# Patient Record
Sex: Female | Born: 1970 | Race: Black or African American | Hispanic: No | Marital: Married | Smoking: Never smoker
Health system: Southern US, Community
[De-identification: ages and names within clinical notes are randomized; demographics above are authoritative.]

## PROBLEM LIST (undated history)

## (undated) DIAGNOSIS — M549 Dorsalgia, unspecified: Secondary | ICD-10-CM

## (undated) DIAGNOSIS — J302 Other seasonal allergic rhinitis: Secondary | ICD-10-CM

## (undated) DIAGNOSIS — R600 Localized edema: Secondary | ICD-10-CM

## (undated) DIAGNOSIS — M19049 Primary osteoarthritis, unspecified hand: Secondary | ICD-10-CM

## (undated) DIAGNOSIS — Z91018 Allergy to other foods: Secondary | ICD-10-CM

## (undated) DIAGNOSIS — E559 Vitamin D deficiency, unspecified: Secondary | ICD-10-CM

## (undated) DIAGNOSIS — K219 Gastro-esophageal reflux disease without esophagitis: Secondary | ICD-10-CM

## (undated) DIAGNOSIS — K5900 Constipation, unspecified: Secondary | ICD-10-CM

## (undated) DIAGNOSIS — M2550 Pain in unspecified joint: Secondary | ICD-10-CM

## (undated) DIAGNOSIS — M170 Bilateral primary osteoarthritis of knee: Secondary | ICD-10-CM

## (undated) HISTORY — DX: Other seasonal allergic rhinitis: J30.2

## (undated) HISTORY — DX: Gastro-esophageal reflux disease without esophagitis: K21.9

## (undated) HISTORY — PX: KNEE ARTHROSCOPY: SUR90

## (undated) HISTORY — DX: Vitamin D deficiency, unspecified: E55.9

## (undated) HISTORY — DX: Primary osteoarthritis, unspecified hand: M19.049

## (undated) HISTORY — DX: Localized edema: R60.0

## (undated) HISTORY — DX: Constipation, unspecified: K59.00

## (undated) HISTORY — DX: Pain in unspecified joint: M25.50

## (undated) HISTORY — DX: Allergy to other foods: Z91.018

## (undated) HISTORY — PX: ABDOMINAL SURGERY: SHX537

## (undated) HISTORY — DX: Bilateral primary osteoarthritis of knee: M17.0

## (undated) HISTORY — DX: Dorsalgia, unspecified: M54.9

## (undated) HISTORY — PX: OTHER SURGICAL HISTORY: SHX169

---

## 2016-04-08 ENCOUNTER — Encounter (HOSPITAL_BASED_OUTPATIENT_CLINIC_OR_DEPARTMENT_OTHER): Payer: Self-pay | Admitting: *Deleted

## 2016-04-08 ENCOUNTER — Emergency Department (HOSPITAL_BASED_OUTPATIENT_CLINIC_OR_DEPARTMENT_OTHER)
Admission: EM | Admit: 2016-04-08 | Discharge: 2016-04-09 | Disposition: A | Discharge status: Home / Self Care | Payer: BLUE CROSS/BLUE SHIELD | Attending: Emergency Medicine | Admitting: Emergency Medicine

## 2016-04-08 DIAGNOSIS — R05 Cough: Secondary | ICD-10-CM | POA: Diagnosis not present

## 2016-04-08 DIAGNOSIS — J029 Acute pharyngitis, unspecified: Secondary | ICD-10-CM | POA: Diagnosis not present

## 2016-04-08 DIAGNOSIS — R059 Cough, unspecified: Secondary | ICD-10-CM

## 2016-04-08 NOTE — ED Triage Notes (Signed)
Pt c/o sore throat x 1 week

## 2016-04-09 LAB — RAPID STREP SCREEN (MED CTR MEBANE ONLY): Streptococcus, Group A Screen (Direct): NEGATIVE

## 2016-04-09 MED ORDER — IBUPROFEN 600 MG PO TABS: 600.0000 mg | ORAL_TABLET | Freq: Four times a day (QID) | ORAL | 0 refills | Status: DC | PRN

## 2016-04-09 MED ORDER — IBUPROFEN 400 MG PO TABS
600.0000 mg | ORAL_TABLET | Freq: Once | ORAL | Status: AC
  Administered 2016-04-09: 600 mg via ORAL
  Filled 2016-04-09: qty 1

## 2016-04-09 MED ORDER — MAGIC MOUTHWASH W/LIDOCAINE: 5.0000 mL | Freq: Three times a day (TID) | ORAL | 0 refills | Status: DC | PRN

## 2016-04-09 MED ORDER — BENZONATATE 100 MG PO CAPS
200.0000 mg | ORAL_CAPSULE | Freq: Once | ORAL | Status: AC
  Administered 2016-04-09: 200 mg via ORAL
  Filled 2016-04-09: qty 2

## 2016-04-09 MED ORDER — BENZONATATE 100 MG PO CAPS: 100.0000 mg | ORAL_CAPSULE | Freq: Three times a day (TID) | ORAL | 0 refills | Status: DC

## 2016-04-09 MED ORDER — IBUPROFEN 400 MG PO TABS
ORAL_TABLET | ORAL | Status: AC
  Filled 2016-04-09: qty 1

## 2016-04-09 NOTE — ED Provider Notes (Signed)
[facility]   CSN: [id number] Arrival date & time: 04/08/16  2346  First Provider Contact:  None       History   Chief Complaint Chief Complaint  Patient presents with  . Sore Throat    HPI Bailey Cooper is a 45 y.o. female.  HPI  Pt comes in with cc of sore throat and cough. Her symptoms have been present off and on for 2 weeks now, but they have worsened over the last 2-3 days. Pt's voice is now hoarse and she has some pain with swallowing. Also has cough, worse at night without any new cough. Pt denies fever and the cough has led to clear phlegm.  History reviewed. No pertinent past medical history.    There are no active problems to display for this patient.   Past Surgical History:  Procedure Laterality Date  . ABDOMINAL SURGERY      OB History    Gravida Para Term Preterm AB Living   1             SAB TAB Ectopic Multiple Live Births                   Home Medications    Prior to Admission medications   Medication Sig Start Date End Date Taking? Authorizing Provider  benzonatate (TESSALON) 100 MG capsule Take 1 capsule (100 mg total) by mouth every 8 (eight) hours. 04/09/16   Derwood Kaplan, MD  ibuprofen (ADVIL,MOTRIN) 600 MG tablet Take 1 tablet (600 mg total) by mouth every 6 (six) hours as needed. 04/09/16   Derwood Kaplan, MD  magic mouthwash w/lidocaine SOLN Take 5 mLs by mouth 3 (three) times daily as needed for mouth pain. 04/09/16   Derwood Kaplan, MD    Family History No family history on file.  Social History Social History  Substance Use Topics  . Smoking status: Never Smoker  . Smokeless tobacco: Not on file  . Alcohol use No     Allergies   Penicillins   Review of Systems Review of Systems  Constitutional: Positive for activity change.  HENT: Positive for sore throat and voice change. Negative for tinnitus and trouble swallowing.   Respiratory: Positive for cough. Negative for shortness of breath and  wheezing.   Cardiovascular: Negative for chest pain.  Gastrointestinal: Negative for abdominal pain, nausea and vomiting.  Genitourinary: Negative for dysuria.  Musculoskeletal: Negative for neck pain.  Neurological: Negative for headaches.     Physical Exam Updated Vital Signs BP 147/94 (BP Location: Left Arm)   Pulse 103   Temp 98.3 F (36.8 C) (Oral)   Ht  (1.676 m)   Wt 243 lb (110.2 kg)   LMP 03/12/2016   SpO2 100%   BMI 39.22 kg/m   Physical Exam  Constitutional: She is oriented to person, place, and time. She appears well-developed and well-nourished.  HENT:  Head: Normocephalic and atraumatic.  Eyes: EOM are normal. Pupils are equal, round, and reactive to light.  Neck: Neck supple. No tracheal deviation present.  Pt has tonsillar erythema and enlargement, no exudate seen. No trismus or drooling seen.  Cardiovascular: Normal rate, regular rhythm and normal heart sounds.   No murmur heard. Pulmonary/Chest: Effort normal. No stridor. No respiratory distress.  Abdominal: Soft. She exhibits no distension. There is no tenderness. There is no rebound and no guarding.  Lymphadenopathy:    She has cervical adenopathy.  Neurological: She is alert and oriented to person,  place, and time.  Skin: Skin is warm and dry.  Nursing note and vitals reviewed.    ED Treatments / Results  Labs (all labs ordered are listed, but only abnormal results are displayed) Labs Reviewed  RAPID STREP SCREEN (NOT AT [facility])  CULTURE, GROUP A STREP [facility])    EKG  EKG Interpretation None       Radiology No results found.  Procedures Procedures (including critical care time)  Medications Ordered in ED Medications  ibuprofen (ADVIL,MOTRIN) tablet 600 mg (600 mg Oral Given 04/09/16 0051)  benzonatate (TESSALON) capsule 200 mg (200 mg Oral Given 04/09/16 0050)     Initial Impression / Assessment and Plan / ED Course  I have reviewed the triage vital signs and the nursing  notes.  Pertinent labs & imaging results that were available during my care of the patient were reviewed by me and considered in my medical decision making (see chart for details).  Clinical Course    Likely viral URI leading to pharyngitis and cough. Pt has no red flags for deep throat infection. Xrays not indicated. Rapid strep is neg. Will tx symptoms.  Final Clinical Impressions(s) / ED Diagnoses   Final diagnoses:  Pharyngitis  Cough    New Prescriptions Discharge Medication List as of 04/09/2016 12:40 AM    START taking these medications   Details  benzonatate (TESSALON) 100 MG capsule Take 1 capsule (100 mg total) by mouth every 8 (eight) hours., Starting Thu 04/09/2016, Print    ibuprofen (ADVIL,MOTRIN) 600 MG tablet Take 1 tablet (600 mg total) by mouth every 6 (six) hours as needed., Starting Thu 04/09/2016, Print    magic mouthwash w/lidocaine SOLN Take 5 mLs by mouth 3 (three) times daily as needed for mouth pain., Starting Thu 04/09/2016, Print         Derwood Kaplan, MD 04/09/16 [phone]

## 2016-04-12 LAB — CULTURE, GROUP A STREP (THRC)

## 2017-09-23 DIAGNOSIS — F902 Attention-deficit hyperactivity disorder, combined type: Secondary | ICD-10-CM | POA: Diagnosis not present

## 2017-10-19 DIAGNOSIS — M25561 Pain in right knee: Secondary | ICD-10-CM | POA: Diagnosis not present

## 2017-11-04 DIAGNOSIS — F320 Major depressive disorder, single episode, mild: Secondary | ICD-10-CM | POA: Diagnosis not present

## 2017-11-23 DIAGNOSIS — F320 Major depressive disorder, single episode, mild: Secondary | ICD-10-CM | POA: Diagnosis not present

## 2018-01-05 DIAGNOSIS — F320 Major depressive disorder, single episode, mild: Secondary | ICD-10-CM | POA: Diagnosis not present

## 2018-01-11 DIAGNOSIS — F320 Major depressive disorder, single episode, mild: Secondary | ICD-10-CM | POA: Diagnosis not present

## 2018-01-19 DIAGNOSIS — F320 Major depressive disorder, single episode, mild: Secondary | ICD-10-CM | POA: Diagnosis not present

## 2018-01-31 DIAGNOSIS — F320 Major depressive disorder, single episode, mild: Secondary | ICD-10-CM | POA: Diagnosis not present

## 2018-02-07 DIAGNOSIS — F320 Major depressive disorder, single episode, mild: Secondary | ICD-10-CM | POA: Diagnosis not present

## 2018-02-21 DIAGNOSIS — F320 Major depressive disorder, single episode, mild: Secondary | ICD-10-CM | POA: Diagnosis not present

## 2018-03-07 DIAGNOSIS — F320 Major depressive disorder, single episode, mild: Secondary | ICD-10-CM | POA: Diagnosis not present

## 2018-03-16 DIAGNOSIS — F320 Major depressive disorder, single episode, mild: Secondary | ICD-10-CM | POA: Diagnosis not present

## 2018-03-23 DIAGNOSIS — F320 Major depressive disorder, single episode, mild: Secondary | ICD-10-CM | POA: Diagnosis not present

## 2018-03-30 DIAGNOSIS — F320 Major depressive disorder, single episode, mild: Secondary | ICD-10-CM | POA: Diagnosis not present

## 2018-04-15 ENCOUNTER — Ambulatory Visit (HOSPITAL_COMMUNITY)
Admission: EM | Admit: 2018-04-15 | Discharge: 2018-04-15 | Disposition: A | Discharge status: Home / Self Care | Payer: BLUE CROSS/BLUE SHIELD | Attending: Internal Medicine | Admitting: Internal Medicine

## 2018-04-15 ENCOUNTER — Encounter (HOSPITAL_COMMUNITY): Payer: Self-pay

## 2018-04-15 DIAGNOSIS — B9789 Other viral agents as the cause of diseases classified elsewhere: Secondary | ICD-10-CM

## 2018-04-15 DIAGNOSIS — J069 Acute upper respiratory infection, unspecified: Secondary | ICD-10-CM

## 2018-04-15 MED ORDER — BENZONATATE 100 MG PO CAPS: 100.0000 mg | ORAL_CAPSULE | Freq: Three times a day (TID) | ORAL | 0 refills | Status: DC

## 2018-04-15 MED ORDER — IPRATROPIUM BROMIDE 0.06 % NA SOLN: 2.0000 | Freq: Four times a day (QID) | NASAL | 0 refills | Status: DC

## 2018-04-15 MED ORDER — HYDROCOD POLST-CPM POLST ER 10-8 MG/5ML PO SUER: 5.0000 mL | Freq: Every evening | ORAL | 0 refills | Status: DC | PRN

## 2018-04-15 NOTE — ED Provider Notes (Signed)
[facility]    CSN: [id number] Arrival date & time: 04/15/18  0818     History   Chief Complaint Chief Complaint  Patient presents with  . Cough    HPI Almarosa Bohac is a 47 y.o. female.   47 year old female comes in for 3-day history of cough.  Denies rhinorrhea, nasal congestion, but states with postnasal drip.  Denies sore throat, ear pain.  Denies fever, chills, night sweats.  Decreased appetite without abdominal pain, nausea, vomiting.  OTC cold medications, Allegra-D, Flonase without relief.  Positive sick contact.  Never smoker.     History reviewed. No pertinent past medical history.  There are no active problems to display for this patient.   Past Surgical History:  Procedure Laterality Date  . ABDOMINAL SURGERY      OB History    Gravida  1   Para      Term      Preterm      AB      Living        SAB      TAB      Ectopic      Multiple      Live Births               Home Medications    Prior to Admission medications   Medication Sig Start Date End Date Taking? Authorizing Provider  benzonatate (TESSALON) 100 MG capsule Take 1 capsule (100 mg total) by mouth every 8 (eight) hours. 04/15/18   Cathie Hoops, Tondalaya Perren V, PA-C  chlorpheniramine-HYDROcodone (TUSSIONEX PENNKINETIC ER) 10-8 MG/5ML SUER Take 5 mLs by mouth at bedtime as needed for cough. 04/15/18   Cathie Hoops, Ellean Firman V, PA-C  ipratropium (ATROVENT) 0.06 % nasal spray Place 2 sprays into both nostrils 4 (four) times daily. 04/15/18   Belinda Fisher, PA-C    Family History Family History  Problem Relation Age of Onset  . Heart disease Mother   . Diabetes Mother   . Hypertension Mother   . Healthy Father     Social History Social History   Tobacco Use  . Smoking status: Never Smoker  Substance Use Topics  . Alcohol use: No  . Drug use: Not on file     Allergies   Penicillins   Review of Systems Review of Systems  Reason unable to perform ROS: See HPI as above.     Physical  Exam Triage Vital Signs ED Triage Vitals  Enc Vitals Group     BP 04/15/18 0856 133/83     Pulse Rate 04/15/18 0856 93     Resp 04/15/18 0856 16     Temp 04/15/18 0856 98.3 F (36.8 C)     Temp Source 04/15/18 0856 Oral     SpO2 04/15/18 0856 96 %     Weight --      Height --      Head Circumference --      Peak Flow --      Pain Score 04/15/18 0857 3     Pain Loc --      Pain Edu? --      Excl. in GC? --    No data found.  Updated Vital Signs BP 133/83 (BP Location: Left Arm)   Pulse 93   Temp 98.3 F (36.8 C) (Oral)   Resp 16   SpO2 96%   Breastfeeding? Unknown   Physical Exam  Constitutional: She is oriented to person, place, and time. She appears  well-developed and well-nourished. No distress.  HENT:  Head: Normocephalic and atraumatic.  Right Ear: Tympanic membrane, external ear and ear canal normal. Tympanic membrane is not erythematous and not bulging.  Left Ear: Tympanic membrane, external ear and ear canal normal. Tympanic membrane is not erythematous and not bulging.  Nose: Nose normal. Right sinus exhibits no maxillary sinus tenderness and no frontal sinus tenderness. Left sinus exhibits no maxillary sinus tenderness and no frontal sinus tenderness.  Mouth/Throat: Uvula is midline, oropharynx is clear and moist and mucous membranes are normal.  Eyes: Pupils are equal, round, and reactive to light. Conjunctivae are normal.  Neck: Normal range of motion. Neck supple.  Cardiovascular: Normal rate, regular rhythm and normal heart sounds. Exam reveals no gallop and no friction rub.  No murmur heard. Pulmonary/Chest: Effort normal and breath sounds normal. No accessory muscle usage or stridor. No respiratory distress. She has no decreased breath sounds. She has no wheezes. She has no rhonchi. She has no rales.  Lymphadenopathy:    She has no cervical adenopathy.  Neurological: She is alert and oriented to person, place, and time.  Skin: Skin is warm and dry.   Psychiatric: She has a normal mood and affect. Her behavior is normal. Judgment normal.   UC Treatments / Results  Labs (all labs ordered are listed, but only abnormal results are displayed) Labs Reviewed - No data to display  EKG None  Radiology No results found.  Procedures Procedures (including critical care time)  Medications Ordered in UC Medications - No data to display  Initial Impression / Assessment and Plan / UC Course  I have reviewed the triage vital signs and the nursing notes.  Pertinent labs & imaging results that were available during my care of the patient were reviewed by me and considered in my medical decision making (see chart for details).    Discussed with patient history and exam most consistent with viral URI. Symptomatic treatment as needed.  Tussionex as needed at nighttime.  Push fluids. Return precautions given.   Final Clinical Impressions(s) / UC Diagnoses   Final diagnoses:  Viral URI with cough    ED Prescriptions    Medication Sig Dispense Auth. Provider   benzonatate (TESSALON) 100 MG capsule Take 1 capsule (100 mg total) by mouth every 8 (eight) hours. 21 capsule Almedia Cordell V, PA-C   ipratropium (ATROVENT) 0.06 % nasal spray Place 2 sprays into both nostrils 4 (four) times daily. 15 mL Maebel Marasco V, PA-C   chlorpheniramine-HYDROcodone (TUSSIONEX PENNKINETIC ER) 10-8 MG/5ML SUER Take 5 mLs by mouth at bedtime as needed for cough. 50 mL Linward HeadlandYu, Reginal Wojcicki V, PA-C     Controlled Substance Prescriptions [place] Controlled Substance Registry consulted? Yes, I have consulted the [place] Controlled Substances Registry for this patient, and feel the risk/benefit ratio today is favorable for proceeding with this prescription for a controlled substance.   Belinda FisherYu, Dacoda Spallone V, PA-C 04/15/18 1108

## 2018-04-15 NOTE — ED Triage Notes (Signed)
Pt presents with cough x 3 days. No relief with OTC medication.

## 2018-04-15 NOTE — Discharge Instructions (Signed)
Tessalon for cough. Tussionex for cough at night. Continue allegra-D, flonase. Start atrovent nasal spray for post nasal drainage. You can use over the counter nasal saline rinse such as neti pot for nasal congestion. Keep hydrated, your urine should be clear to pale yellow in color. Tylenol/motrin for fever and pain. Monitor for any worsening of symptoms, chest pain, shortness of breath, wheezing, swelling of the throat, follow up for reevaluation.   For sore throat/cough try using a honey-based tea. Use 3 teaspoons of honey with juice squeezed from half lemon. Place shaved pieces of ginger into 1/2-1 cup of water and warm over stove top. Then mix the ingredients and repeat every 4 hours as needed.

## 2018-04-25 ENCOUNTER — Ambulatory Visit (HOSPITAL_COMMUNITY)
Admission: EM | Admit: 2018-04-25 | Discharge: 2018-04-25 | Discharge status: Absent without leave | Payer: BLUE CROSS/BLUE SHIELD

## 2018-04-25 ENCOUNTER — Ambulatory Visit (INDEPENDENT_AMBULATORY_CARE_PROVIDER_SITE_OTHER): Payer: BLUE CROSS/BLUE SHIELD

## 2018-04-25 ENCOUNTER — Ambulatory Visit (HOSPITAL_COMMUNITY)
Admission: EM | Admit: 2018-04-25 | Discharge: 2018-04-25 | Disposition: A | Discharge status: Home / Self Care | Payer: BLUE CROSS/BLUE SHIELD | Attending: Family Medicine | Admitting: Family Medicine

## 2018-04-25 ENCOUNTER — Encounter (HOSPITAL_COMMUNITY): Payer: Self-pay | Admitting: Emergency Medicine

## 2018-04-25 DIAGNOSIS — J22 Unspecified acute lower respiratory infection: Secondary | ICD-10-CM

## 2018-04-25 DIAGNOSIS — R05 Cough: Secondary | ICD-10-CM | POA: Diagnosis not present

## 2018-04-25 MED ORDER — ALBUTEROL SULFATE HFA 108 (90 BASE) MCG/ACT IN AERS: 1.0000 | INHALATION_SPRAY | Freq: Four times a day (QID) | RESPIRATORY_TRACT | 0 refills | Status: DC | PRN

## 2018-04-25 MED ORDER — AZITHROMYCIN 250 MG PO TABS: ORAL_TABLET | ORAL | 0 refills | Status: AC

## 2018-04-25 NOTE — Discharge Instructions (Signed)
Push fluids to ensure adequate hydration and keep secretions thin.  Tylenol and/or ibuprofen as needed for pain or fevers.  Use of inhaler as needed for cough or shortness of breath .  May continue with over the counter treatments and medications prescribed as needed for symptoms.  Sudafed may be helpful with secretions.  If symptoms worsen or do not improve in the next week to return to be seen or to follow up with your PCP.

## 2018-04-25 NOTE — ED Triage Notes (Signed)
Pt here for cough x 3 weeks with some blood tinged sputum at times

## 2018-04-25 NOTE — ED Notes (Signed)
Bed: UC01 Expected date:  Expected time:  Means of arrival:  Comments: Appt 

## 2018-04-25 NOTE — ED Provider Notes (Signed)
[facility]    CSN: [id number] Arrival date & time: 04/25/18  1450     History   Chief Complaint Chief Complaint  Patient presents with  . Cough    appt 1448    HPI Bailey Cooper is a 47 y.o. female.   Bailey Cooper presents with complaints of persistent cough and congestion. Was seen here 8/2 and states symptoms started up to a week prior. Symptoms have somewhat waxed and waned but over the past few days worsened. Hard time sleeping due to cough and post nasal drip. At times will cough so hard makes her vomit. Decreased appetite. No fevers. No ear pain or sore throat. Chest and ribs feel sore from coughing. Has noticed some blood tinged sputum at times. Has been using nasal spray, mucinex, tessalon, delsym which have not helped. Son and mother with pna. States has had history of wheezing in the past but no asthma/copd diagnosis. Does not smoke. Without contributing medical history.      ROS per HPI.      History reviewed. No pertinent past medical history.  There are no active problems to display for this patient.   Past Surgical History:  Procedure Laterality Date  . ABDOMINAL SURGERY      OB History    Gravida  1   Para      Term      Preterm      AB      Living        SAB      TAB      Ectopic      Multiple      Live Births               Home Medications    Prior to Admission medications   Medication Sig Start Date End Date Taking? Authorizing Provider  albuterol (PROAIR HFA) 108 (90 Base) MCG/ACT inhaler Inhale 1-2 puffs into the lungs every 6 (six) hours as needed for wheezing or shortness of breath. 04/25/18   Georgetta HaberBurky, Natalie B, NP  azithromycin (ZITHROMAX) 250 MG tablet Take 2 tablets (500 mg total) by mouth daily for 1 day, THEN 1 tablet (250 mg total) daily for 4 days. 04/25/18 04/30/18  Georgetta HaberBurky, Natalie B, NP  benzonatate (TESSALON) 100 MG capsule Take 1 capsule (100 mg total) by mouth every 8 (eight) hours. 04/15/18   Cathie HoopsYu, Amy V, PA-C    chlorpheniramine-HYDROcodone (TUSSIONEX PENNKINETIC ER) 10-8 MG/5ML SUER Take 5 mLs by mouth at bedtime as needed for cough. Patient not taking: Reported on 04/25/2018 04/15/18   Belinda FisherYu, Amy V, PA-C  ipratropium (ATROVENT) 0.06 % nasal spray Place 2 sprays into both nostrils 4 (four) times daily. 04/15/18   Belinda FisherYu, Amy V, PA-C    Family History Family History  Problem Relation Age of Onset  . Heart disease Mother   . Diabetes Mother   . Hypertension Mother   . Healthy Father     Social History Social History   Tobacco Use  . Smoking status: Never Smoker  Substance Use Topics  . Alcohol use: No  . Drug use: Not on file     Allergies   Penicillins   Review of Systems Review of Systems   Physical Exam Triage Vital Signs ED Triage Vitals  Enc Vitals Group     BP 04/25/18 1508 137/80     Pulse Rate 04/25/18 1508 85     Resp 04/25/18 1508 18     Temp 04/25/18 1508 98.7  F (37.1 C)     Temp Source 04/25/18 1508 Oral     SpO2 04/25/18 1508 98 %     Weight --      Height --      Head Circumference --      Peak Flow --      Pain Score 04/25/18 1509 7     Pain Loc --      Pain Edu? --      Excl. in GC? --    No data found.  Updated Vital Signs BP 137/80 (BP Location: Left Arm)   Pulse 85   Temp 98.7 F (37.1 C) (Oral)   Resp 18   SpO2 98%    Physical Exam  Constitutional: She is oriented to person, place, and time. She appears well-developed and well-nourished. No distress.  HENT:  Head: Normocephalic and atraumatic.  Right Ear: Tympanic membrane, external ear and ear canal normal.  Left Ear: Tympanic membrane, external ear and ear canal normal.  Nose: Nose normal.  Mouth/Throat: Uvula is midline, oropharynx is clear and moist and mucous membranes are normal. No tonsillar exudate.  Eyes: Pupils are equal, round, and reactive to light. Conjunctivae and EOM are normal.  Cardiovascular: Normal rate, regular rhythm and normal heart sounds.  Pulmonary/Chest: Effort  normal and breath sounds normal.  Frequent strong dry cough noted, illicite by deep inspiration as well as by speaking too long   Neurological: She is alert and oriented to person, place, and time.  Skin: Skin is warm and dry.     UC Treatments / Results  Labs (all labs ordered are listed, but only abnormal results are displayed) Labs Reviewed - No data to display  EKG None  Radiology Dg Chest 2 View  Result Date: 04/25/2018 CLINICAL DATA:  Cough and blood tinged sputum EXAM: CHEST - 2 VIEW COMPARISON:  None. FINDINGS: The heart size and mediastinal contours are within normal limits. Both lungs are clear. The visualized skeletal structures are unremarkable. IMPRESSION: No active cardiopulmonary disease. Electronically Signed   By: Deatra Robinson M.D.   On: 04/25/2018 15:48    Procedures Procedures (including critical care time)  Medications Ordered in UC Medications - No data to display  Initial Impression / Assessment and Plan / UC Course  I have reviewed the triage vital signs and the nursing notes.  Pertinent labs & imaging results that were available during my care of the patient were reviewed by me and considered in my medical decision making (see chart for details).     >10 days of symptoms. Chest xray reassuring. Will provide empiric coverage with azithromycin at this time. Continue with over the counter treatments as needed. Albuterol inhaler as needed Return precautions provided. Patient verbalized understanding and agreeable to plan.   Final Clinical Impressions(s) / UC Diagnoses   Final diagnoses:  Lower respiratory infection     Discharge Instructions     Push fluids to ensure adequate hydration and keep secretions thin.  Tylenol and/or ibuprofen as needed for pain or fevers.  Use of inhaler as needed for cough or shortness of breath .  May continue with over the counter treatments and medications prescribed as needed for symptoms.  Sudafed may be helpful  with secretions.  If symptoms worsen or do not improve in the next week to return to be seen or to follow up with your PCP.     ED Prescriptions    Medication Sig Dispense Auth. Provider   azithromycin (ZITHROMAX) 250  MG tablet Take 2 tablets (500 mg total) by mouth daily for 1 day, THEN 1 tablet (250 mg total) daily for 4 days. 6 tablet Linus MakoBurky, Natalie B, NP   albuterol (PROAIR HFA) 108 (90 Base) MCG/ACT inhaler Inhale 1-2 puffs into the lungs every 6 (six) hours as needed for wheezing or shortness of breath. 1 Inhaler Georgetta HaberBurky, Natalie B, NP     Controlled Substance Prescriptions [place] Controlled Substance Registry consulted? Not Applicable   Georgetta HaberBurky, Natalie B, NP 04/25/18 1607

## 2018-05-27 DIAGNOSIS — M489 Spondylopathy, unspecified: Secondary | ICD-10-CM | POA: Diagnosis not present

## 2018-05-27 DIAGNOSIS — J309 Allergic rhinitis, unspecified: Secondary | ICD-10-CM | POA: Diagnosis not present

## 2018-05-27 DIAGNOSIS — N393 Stress incontinence (female) (male): Secondary | ICD-10-CM | POA: Diagnosis not present

## 2018-05-27 DIAGNOSIS — G5600 Carpal tunnel syndrome, unspecified upper limb: Secondary | ICD-10-CM | POA: Diagnosis not present

## 2018-05-27 DIAGNOSIS — Z23 Encounter for immunization: Secondary | ICD-10-CM | POA: Diagnosis not present

## 2018-05-31 DIAGNOSIS — Z6841 Body Mass Index (BMI) 40.0 and over, adult: Secondary | ICD-10-CM | POA: Diagnosis not present

## 2018-05-31 DIAGNOSIS — Z1151 Encounter for screening for human papillomavirus (HPV): Secondary | ICD-10-CM | POA: Diagnosis not present

## 2018-05-31 DIAGNOSIS — Z23 Encounter for immunization: Secondary | ICD-10-CM | POA: Diagnosis not present

## 2018-05-31 DIAGNOSIS — Z113 Encounter for screening for infections with a predominantly sexual mode of transmission: Secondary | ICD-10-CM | POA: Diagnosis not present

## 2018-05-31 DIAGNOSIS — Z124 Encounter for screening for malignant neoplasm of cervix: Secondary | ICD-10-CM | POA: Diagnosis not present

## 2018-05-31 DIAGNOSIS — Z0000 Encounter for general adult medical examination without abnormal findings: Secondary | ICD-10-CM | POA: Diagnosis not present

## 2018-07-12 DIAGNOSIS — M25561 Pain in right knee: Secondary | ICD-10-CM | POA: Diagnosis not present

## 2018-07-25 DIAGNOSIS — Z309 Encounter for contraceptive management, unspecified: Secondary | ICD-10-CM | POA: Diagnosis not present

## 2018-08-22 DIAGNOSIS — M25561 Pain in right knee: Secondary | ICD-10-CM | POA: Diagnosis not present

## 2018-09-02 DIAGNOSIS — M25561 Pain in right knee: Secondary | ICD-10-CM | POA: Diagnosis not present

## 2019-02-06 IMAGING — DX DG CHEST 2V
2 series · 2 of 2 positions shown · non-contrast
Comparison: None.

CLINICAL DATA: Cough and blood tinged sputum

EXAM:
CHEST - 2 VIEW

[chest pa]
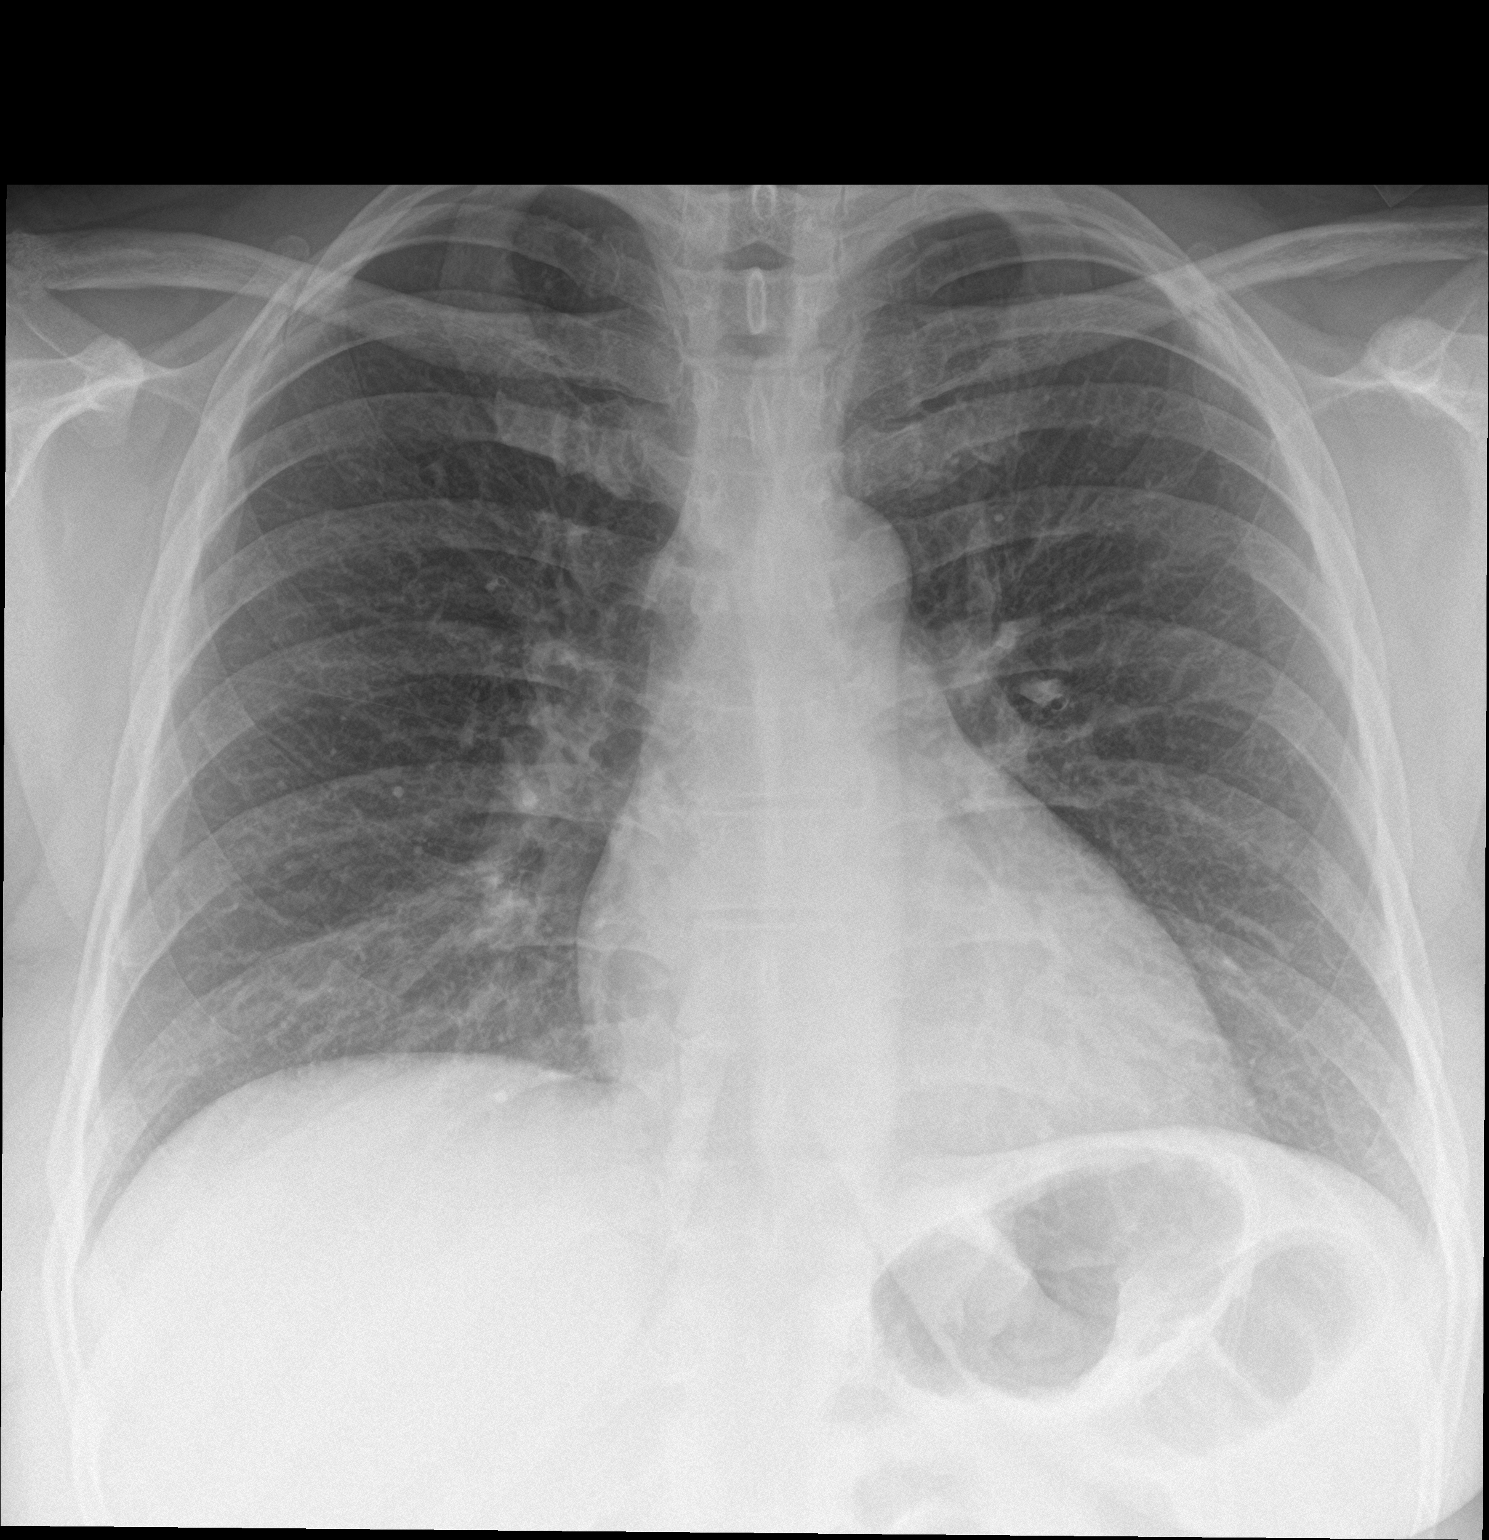

[chest lat]
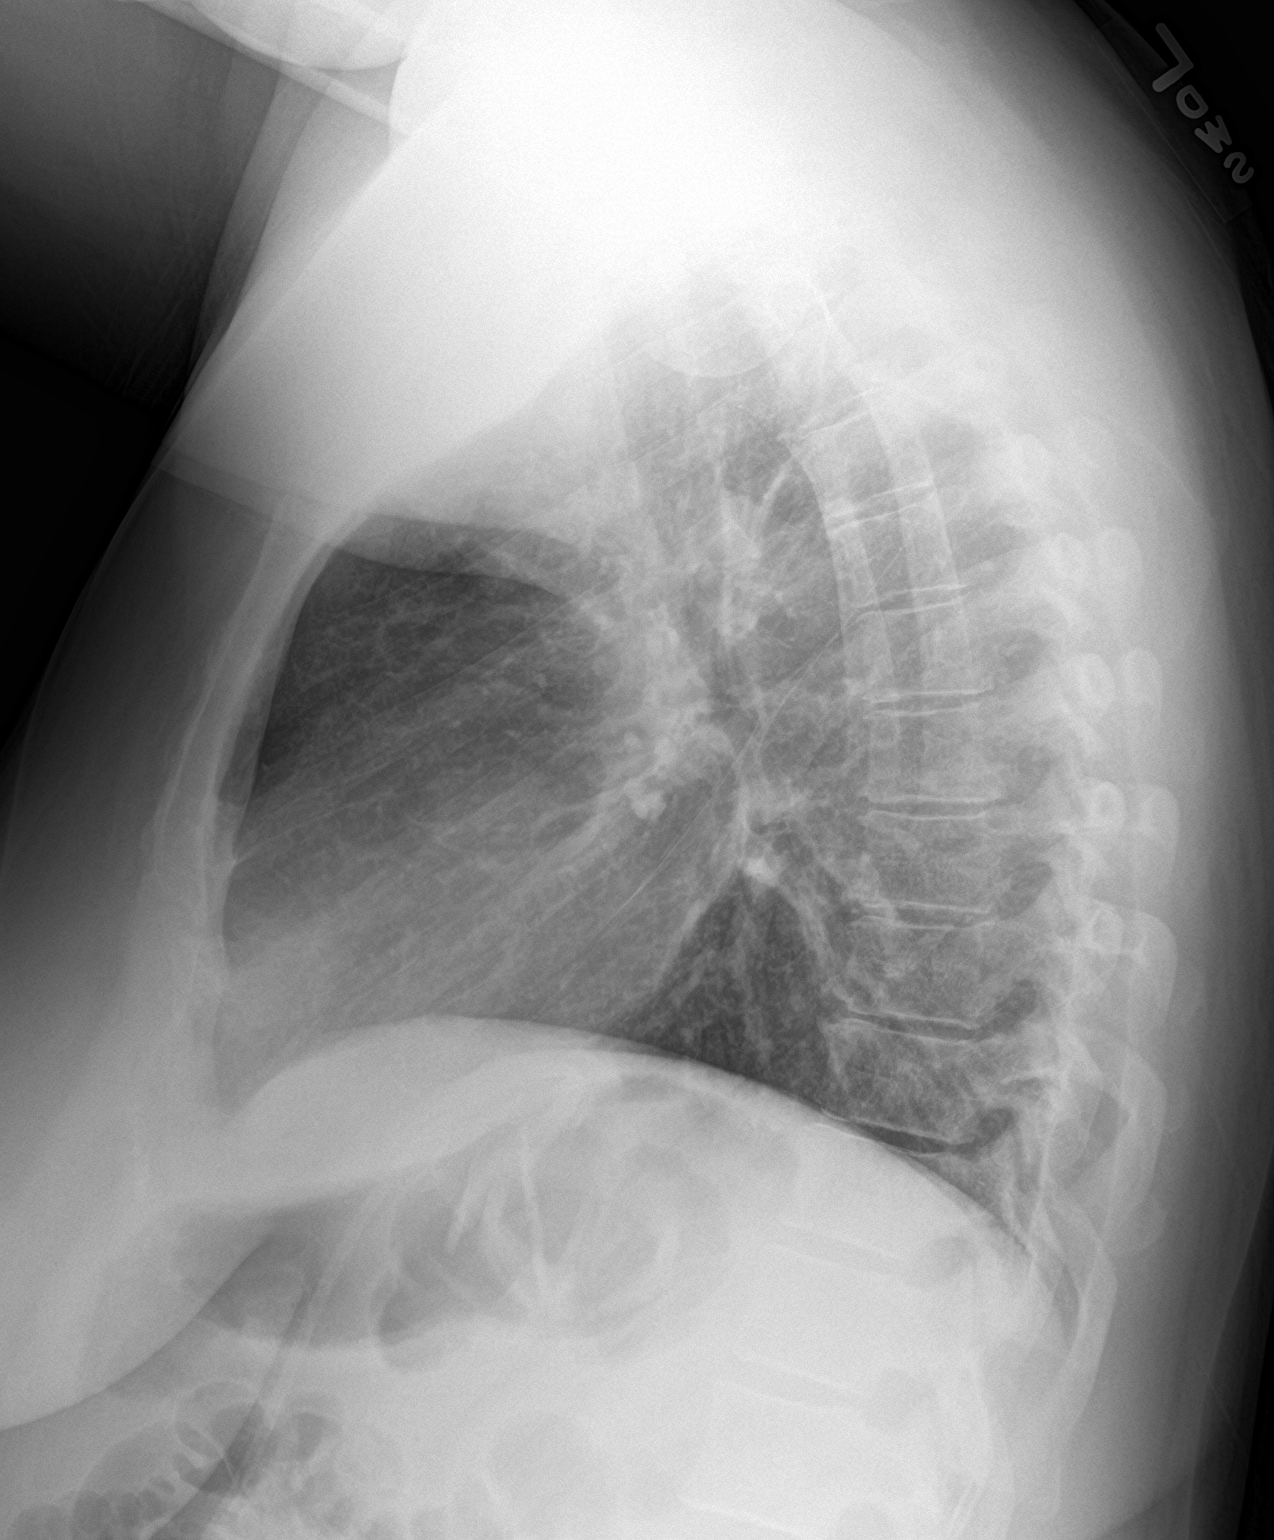

[2 of 2 positions shown; findings below may reference images not displayed]

FINDINGS: The heart size and mediastinal contours are within normal limits.
Both lungs are clear. The visualized skeletal structures are
unremarkable.
IMPRESSION: No active cardiopulmonary disease.

## 2020-07-16 LAB — EXTERNAL GENERIC LAB PROCEDURE: COLOGUARD: NEGATIVE

## 2021-06-11 ENCOUNTER — Other Ambulatory Visit: Payer: Self-pay | Admitting: Family Medicine

## 2021-06-11 DIAGNOSIS — M25511 Pain in right shoulder: Secondary | ICD-10-CM

## 2021-06-21 ENCOUNTER — Other Ambulatory Visit: Payer: Self-pay | Admitting: Family Medicine

## 2021-06-21 DIAGNOSIS — Z1231 Encounter for screening mammogram for malignant neoplasm of breast: Secondary | ICD-10-CM

## 2021-07-01 ENCOUNTER — Other Ambulatory Visit: Payer: BLUE CROSS/BLUE SHIELD

## 2021-07-05 ENCOUNTER — Ambulatory Visit
Admission: RE | Admit: 2021-07-05 | Discharge: 2021-07-05 | Disposition: A | Discharge status: Home / Self Care | Payer: BLUE CROSS/BLUE SHIELD | Source: Ambulatory Visit | Attending: Family Medicine | Admitting: Family Medicine

## 2021-07-05 ENCOUNTER — Other Ambulatory Visit: Payer: Self-pay

## 2021-07-05 DIAGNOSIS — M25511 Pain in right shoulder: Secondary | ICD-10-CM

## 2021-07-18 ENCOUNTER — Ambulatory Visit
Admission: RE | Admit: 2021-07-18 | Discharge: 2021-07-18 | Disposition: A | Discharge status: Home / Self Care | Payer: 59 | Source: Ambulatory Visit | Attending: Family Medicine | Admitting: Family Medicine

## 2021-07-18 ENCOUNTER — Encounter: Payer: Self-pay | Admitting: Neurology

## 2021-07-18 ENCOUNTER — Other Ambulatory Visit: Payer: Self-pay

## 2021-07-18 DIAGNOSIS — R202 Paresthesia of skin: Secondary | ICD-10-CM

## 2021-07-18 DIAGNOSIS — Z1231 Encounter for screening mammogram for malignant neoplasm of breast: Secondary | ICD-10-CM

## 2021-07-22 ENCOUNTER — Other Ambulatory Visit: Payer: Self-pay | Admitting: Family Medicine

## 2021-07-22 DIAGNOSIS — R928 Other abnormal and inconclusive findings on diagnostic imaging of breast: Secondary | ICD-10-CM

## 2021-08-14 ENCOUNTER — Other Ambulatory Visit: Payer: Self-pay

## 2021-08-14 ENCOUNTER — Ambulatory Visit (INDEPENDENT_AMBULATORY_CARE_PROVIDER_SITE_OTHER): Payer: 59 | Admitting: Neurology

## 2021-08-14 DIAGNOSIS — R202 Paresthesia of skin: Secondary | ICD-10-CM

## 2021-08-14 NOTE — Procedures (Signed)
[facility]  [address] [place], [address]  [place], [place] [address] Tel: [phone] Fax:  [phone] Test Date:  08/14/2021  Patient: Bailey Cooper DOB: 25-Jul-1971 Physician: Nita Sickle, DO  Sex: Female Height: 5\' 6"  Ref Phys: , MD  ID#: [id number]   Technician:    Patient Complaints: This is a 50 year old female referred for evaluation of right arm paresthesias.    NCV & EMG Findings: Extensive electrodiagnostic testing of the right upper extremity shows: Right median, ulnar, and mixed palmar sensory responses are within normal limits. Right median and ulnar motor responses are within normal limits. There is no evidence of active or chronic motor axonal loss changes affecting any of the tested muscles.  Motor unit configuration and recruitment pattern is within normal limits.  Impression: This is a normal study of the right upper extremity.  In particular, there is no evidence of a cervical radiculopathy or carpal tunnel syndrome.   ___________________________ 44, DO    Nerve Conduction Studies Anti Sensory Summary Table   Stim Site NR Peak (ms) Norm Peak (ms) P-T Amp (V) Norm P-T Amp  Right Median Anti Sensory (2nd Digit)  34C  Wrist    3.3 <3.6 44.1 >15  Right Ulnar Anti Sensory (5th Digit)  34C  Wrist    2.8 <3.1 28.9 >10   Motor Summary Table   Stim Site NR Onset (ms) Norm Onset (ms) O-P Amp (mV) Norm O-P Amp Site1 Site2 Delta-0 (ms) Dist (cm) Vel (m/s) Norm Vel (m/s)  Right Median Motor (Abd Poll Brev)  34C  Wrist    3.0 <4.0 11.5 >6 Elbow Wrist 5.4 30.0 56 >50  Elbow    8.4  11.3         Right Ulnar Motor (Abd Dig Minimi)  34C  Wrist    2.3 <3.1 8.4 >7 B Elbow Wrist 4.3 23.0 53 >50  B Elbow    6.6  7.9  A Elbow B Elbow 1.7 10.0 59 >50  A Elbow    8.3  7.9          Comparison Summary Table   Stim Site NR Peak (ms) Norm Peak (ms) P-T Amp (V) Site1 Site2 Delta-P (ms) Norm Delta (ms)  Right Median/Ulnar Palm  Comparison (Wrist - 8cm)  34C  Median Palm    1.9 <2.2 54.9 Median Palm Ulnar Palm 0.2   Ulnar Palm    1.7 <2.2 9.9       EMG   Side Muscle Ins Act Fibs Psw Fasc Number Recrt Dur Dur. Amp Amp. Poly Poly. Comment  Right 1stDorInt Nml Nml Nml Nml Nml Nml Nml Nml Nml Nml Nml Nml N/A  Right PronatorTeres Nml Nml Nml Nml Nml Nml Nml Nml Nml Nml Nml Nml N/A  Right Biceps Nml Nml Nml Nml Nml Nml Nml Nml Nml Nml Nml Nml N/A  Right Triceps Nml Nml Nml Nml Nml Nml Nml Nml Nml Nml Nml Nml N/A  Right Deltoid Nml Nml Nml Nml Nml Nml Nml Nml Nml Nml Nml Nml N/A      Waveforms:

## 2021-08-15 ENCOUNTER — Other Ambulatory Visit: Payer: 59

## 2021-10-22 ENCOUNTER — Other Ambulatory Visit: Payer: Self-pay

## 2021-11-06 ENCOUNTER — Other Ambulatory Visit: Payer: Self-pay

## 2022-04-07 ENCOUNTER — Emergency Department (HOSPITAL_BASED_OUTPATIENT_CLINIC_OR_DEPARTMENT_OTHER)
Admission: EM | Admit: 2022-04-07 | Discharge: 2022-04-08 | Disposition: A | Discharge status: Home / Self Care | Payer: Commercial Managed Care - HMO | Attending: Emergency Medicine | Admitting: Emergency Medicine

## 2022-04-07 ENCOUNTER — Emergency Department (HOSPITAL_BASED_OUTPATIENT_CLINIC_OR_DEPARTMENT_OTHER): Payer: Commercial Managed Care - HMO | Admitting: Radiology

## 2022-04-07 ENCOUNTER — Other Ambulatory Visit: Payer: Self-pay

## 2022-04-07 ENCOUNTER — Encounter (HOSPITAL_BASED_OUTPATIENT_CLINIC_OR_DEPARTMENT_OTHER): Payer: Self-pay | Admitting: Emergency Medicine

## 2022-04-07 ENCOUNTER — Ambulatory Visit: Admit: 2022-04-07 | Discharge status: Absent without leave | Payer: Managed Care, Other (non HMO)

## 2022-04-07 DIAGNOSIS — R0789 Other chest pain: Secondary | ICD-10-CM | POA: Insufficient documentation

## 2022-04-07 DIAGNOSIS — J45909 Unspecified asthma, uncomplicated: Secondary | ICD-10-CM | POA: Insufficient documentation

## 2022-04-07 DIAGNOSIS — R0602 Shortness of breath: Secondary | ICD-10-CM | POA: Diagnosis not present

## 2022-04-07 LAB — BASIC METABOLIC PANEL
Anion gap: 8 (ref 5–15)
BUN: 14 mg/dL (ref 6–20)
CO2: 25 mmol/L (ref 22–32)
Calcium: 10.7 mg/dL — ABNORMAL HIGH (ref 8.9–10.3)
Chloride: 103 mmol/L (ref 98–111)
Creatinine, Ser: 1.22 mg/dL — ABNORMAL HIGH (ref 0.44–1.00)
GFR, Estimated: 54 mL/min — ABNORMAL LOW (ref >60)
Glucose, Bld: 91 mg/dL (ref 70–99)
Potassium: 3.8 mmol/L (ref 3.5–5.1)
Sodium: 136 mmol/L (ref 135–145)

## 2022-04-07 LAB — CBC
HCT: 43.3 % (ref 36.0–46.0)
Hemoglobin: 13.8 g/dL (ref 12.0–15.0)
MCH: 26.4 pg (ref 26.0–34.0)
MCHC: 31.9 g/dL (ref 30.0–36.0)
MCV: 82.8 fL (ref 80.0–100.0)
Platelets: 211 10*3/uL (ref 150–400)
RBC: 5.23 MIL/uL — ABNORMAL HIGH (ref 3.87–5.11)
RDW: 14.6 % (ref 11.5–15.5)
WBC: 4.2 10*3/uL (ref 4.0–10.5)
nRBC: 0 % (ref 0.0–0.2)

## 2022-04-07 LAB — TROPONIN I (HIGH SENSITIVITY): Troponin I (High Sensitivity): 2 ng/L (ref <18)

## 2022-04-07 NOTE — ED Provider Notes (Signed)
[facility] EMERGENCY DEPT Provider Note   CSN: [id number] Arrival date & time: 04/07/22  1924     History  Chief Complaint  Patient presents with   Chest Pain    Bailey Cooper is a 51 y.o. female.  Patient is a 51 year old female with past medical history of asthma.  She presents today for evaluation of chest discomfort.  She describes a 3-day history of what she describes as the sensation of something "pressing into her chest".  This has been constant, but has waxed and waned.  It is worse when she palpates the area.  She reports some shortness of breath over the past 3 days, but denies any nausea, diaphoresis, or radiation to the arm or jaw.  She denies any exertional symptoms.  She has no cardiac risk factors.  The history is provided by the patient.       Home Medications Prior to Admission medications   Medication Sig Start Date End Date Taking? Authorizing Provider  albuterol (PROAIR HFA) 108 (90 Base) MCG/ACT inhaler Inhale 1-2 puffs into the lungs every 6 (six) hours as needed for wheezing or shortness of breath. 04/25/18   Georgetta Haber, NP  benzonatate (TESSALON) 100 MG capsule Take 1 capsule (100 mg total) by mouth every 8 (eight) hours. 04/15/18   Cathie Hoops, Amy V, PA-C  chlorpheniramine-HYDROcodone (TUSSIONEX PENNKINETIC ER) 10-8 MG/5ML SUER Take 5 mLs by mouth at bedtime as needed for cough. Patient not taking: Reported on 04/25/2018 04/15/18   Belinda Fisher, PA-C  ipratropium (ATROVENT) 0.06 % nasal spray Place 2 sprays into both nostrils 4 (four) times daily. 04/15/18   Belinda Fisher, PA-C      Allergies    Penicillins    Review of Systems   Review of Systems  All other systems reviewed and are negative.   Physical Exam Updated Vital Signs BP (!) 128/95   Pulse 70   Temp 98.6 F (37 C)   Resp 12   SpO2 100%  Physical Exam Vitals and nursing note reviewed.  Constitutional:      General: She is not in acute distress.    Appearance: She is well-developed.  She is not diaphoretic.  HENT:     Head: Normocephalic and atraumatic.  Cardiovascular:     Rate and Rhythm: Normal rate and regular rhythm.     Heart sounds: No murmur heard.    No friction rub. No gallop.  Pulmonary:     Effort: Pulmonary effort is normal. No respiratory distress.     Breath sounds: Normal breath sounds. No wheezing.     Comments: Discomfort is reproducible with palpation of the anterior chest wall Abdominal:     General: Bowel sounds are normal. There is no distension.     Palpations: Abdomen is soft.     Tenderness: There is no abdominal tenderness.  Musculoskeletal:        General: Normal range of motion.     Cervical back: Normal range of motion and neck supple.     Right lower leg: No tenderness. No edema.     Left lower leg: No tenderness. No edema.  Skin:    General: Skin is warm and dry.  Neurological:     General: No focal deficit present.     Mental Status: She is alert and oriented to person, place, and time.     ED Results / Procedures / Treatments   Labs (all labs ordered are listed, but only abnormal results are  displayed) Labs Reviewed  BASIC METABOLIC PANEL - Abnormal; Notable for the following components:      Result Value   Creatinine, Ser 1.22 (*)    Calcium 10.7 (*)    GFR, Estimated 54 (*)    All other components within normal limits  CBC - Abnormal; Notable for the following components:   RBC 5.23 (*)    All other components within normal limits  TROPONIN I (HIGH SENSITIVITY)    EKG EKG Interpretation  Date/Time:  Tuesday April 07 2022 19:34:35 EDT Ventricular Rate:  80 PR Interval:  150 QRS Duration: 90 QT Interval:  362 QTC Calculation: 417 R Axis:   19 Text Interpretation: Normal sinus rhythm Cannot rule out Anterior infarct , age undetermined Abnormal ECG No previous ECGs available Confirmed by Geoffery Lyons ([id number]) on 04/07/2022 11:35:52 PM  Radiology DG Chest 2 View  Result Date: 04/07/2022 CLINICAL DATA:  Chest  discomfort with midsternal chest pain for 2 days. EXAM: CHEST - 2 VIEW COMPARISON:  Radiographs 04/25/2018. FINDINGS: Suboptimal inspiration on both views with mild motion artifact on the lateral view. The heart size and mediastinal contours are stable. Allowing for the low lung volumes, the lungs remain clear. There is no pleural effusion or pneumothorax. Mild degenerative changes within the thoracic spine. 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 IMPRESSION: Suboptimal inspiration. No evidence of active cardiopulmonary process. Electronically Signed   By: Carey Bullocks M.D.   On: 04/07/2022 20:10    Procedures Procedures    Medications Ordered in ED Medications - No data to display  ED Course/ Medical Decision Making/ A&P  Patient presenting here with complaints of chest discomfort as described in the HPI.  Symptoms most likely musculoskeletal in nature or anxiety induced.  There is no evidence for a cardiac etiology despite 3 days of ongoing symptoms.  She has no EKG changes and troponin is negative.  Vitals are stable with no hypoxia or tachycardia and I highly doubt pulmonary embolism or other emergent pathology.  At this point, I feel as though she can safely be discharged with NSAIDs, rest, and follow-up as needed.  I will place a cardiology referral for her to discuss a stress test.  She understands to return in the meantime if symptoms significantly worsen or change.  Final Clinical Impression(s) / ED Diagnoses Final diagnoses:  None    Rx / DC Orders ED Discharge Orders     None         Geoffery Lyons, MD 04/07/22 2355

## 2022-04-07 NOTE — ED Triage Notes (Signed)
Mid sternal chest pain. Started Sunday. Pain stops when sitting/rest. Reports some lightheadedness Denies n/v/d,  Recently stopped weight loss medications

## 2022-04-07 NOTE — Discharge Instructions (Signed)
Begin taking ibuprofen 600 mg every 6 hours as needed for pain  Follow-up with cardiology to discuss stress testing, and return to the ER if symptoms significantly worsen or change.

## 2022-04-08 NOTE — ED Notes (Signed)
Reviewed AVS/discharge instruction with patient. Time allotted for and all questions answered. Patient is agreeable for d/c and escorted to ed exit by staff.  

## 2022-04-18 IMAGING — MR MR SHOULDER*R* W/O CM
5 series · 35 of 40 positions shown · non-contrast
Comparison: None.

CLINICAL DATA: Patient complains of generalized right shoulder pain
with limited range of motion for 5 months. No history of surgery
reported.

EXAM:
MRI OF THE RIGHT SHOULDER WITHOUT CONTRAST
TECHNIQUE: Multiplanar, multisequence MR imaging of the shoulder was performed.
No intravenous contrast was administered.

[Series 3: T2 fat-sat · axial · 4.0mm · 0.55mm/px · z∈[-26,+86]mm · 8 of 25 slices shown (1 of 3)]
[im 1/25]
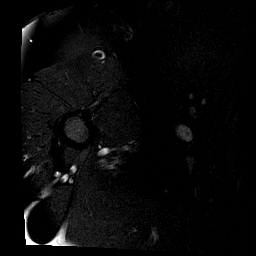
[im 3/25]
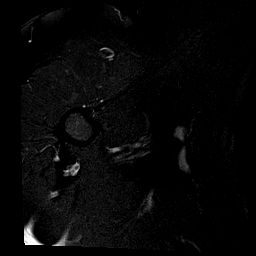
[im 9/25]
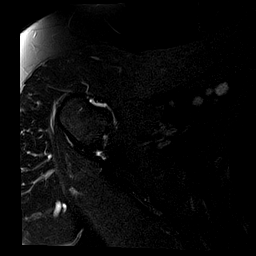
[im 11/25]
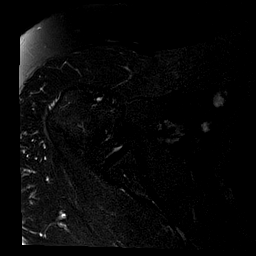
[im 14/25]
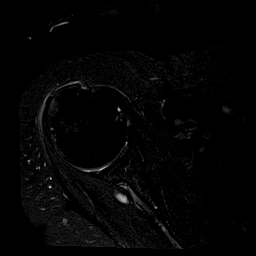
[im 17/25]
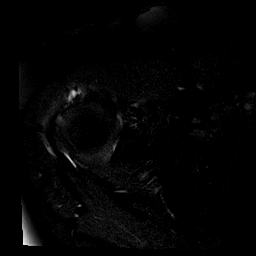
[im 22/25]
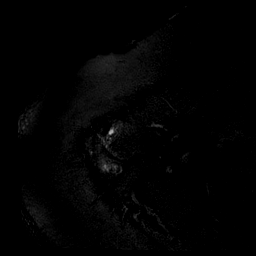
[im 25/25]
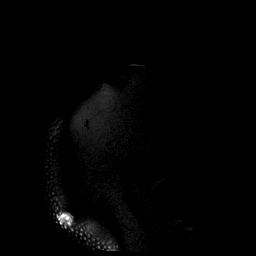

[Series 4: T2 fat-sat · oblique · 4.0mm · 0.59mm/px · 8 of 21 slices shown (2 of 3)]
[im 1/21]
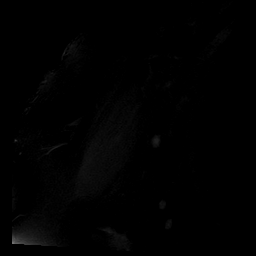
[im 3/21]
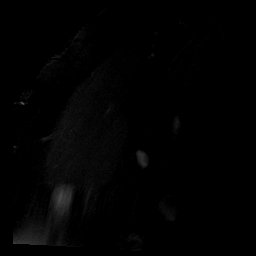
[im 6/21]
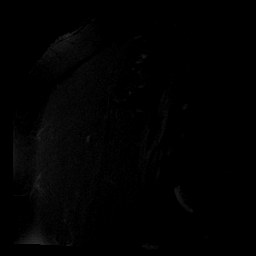
[im 9/21]
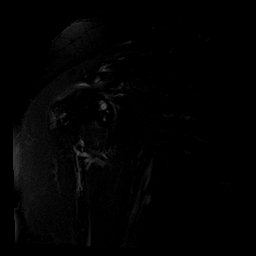
[im 12/21]
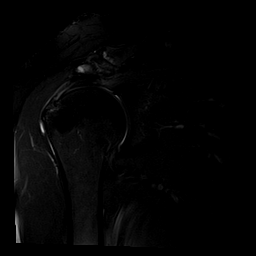
[im 15/21]
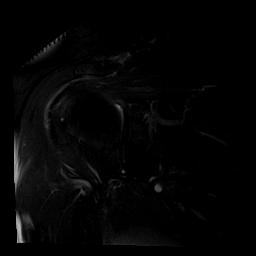
[im 18/21]
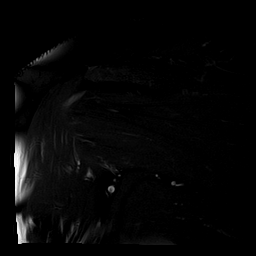
[im 21/21]
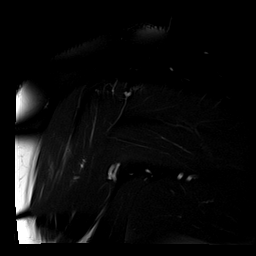

[Series 5: PD · oblique · 4.0mm · 0.29mm/px · 8 of 21 slices shown]
[im 1/21]
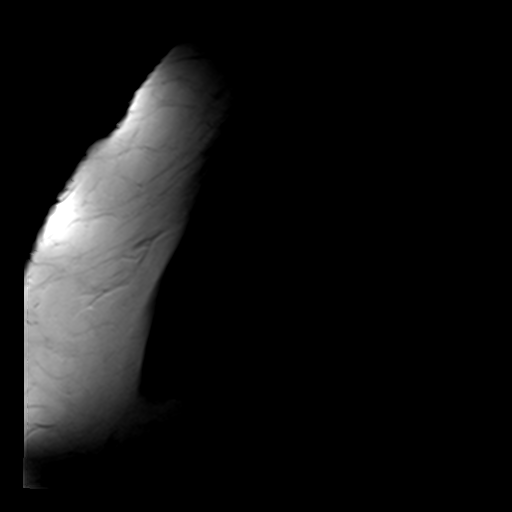
[im 3/21]
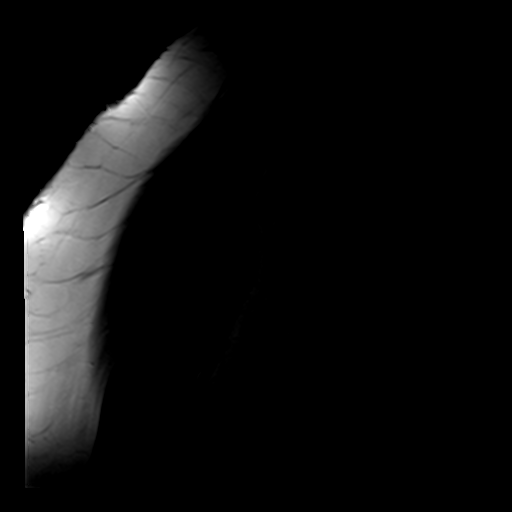
[im 6/21]
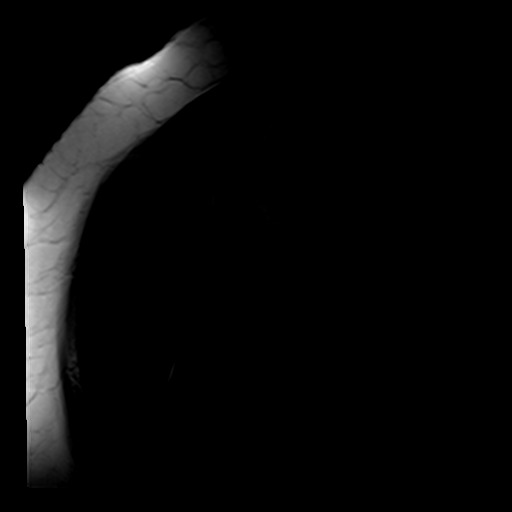
[im 9/21]
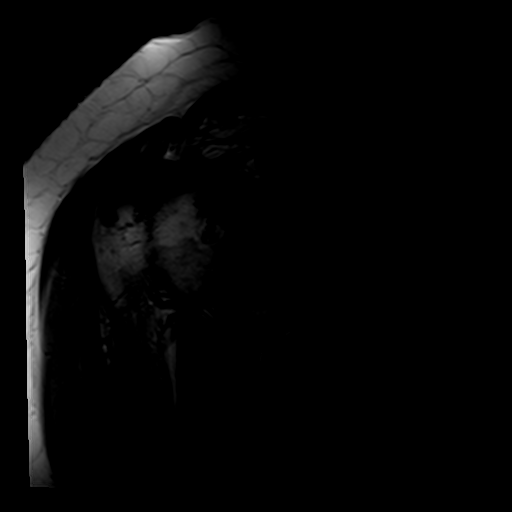
[im 12/21]
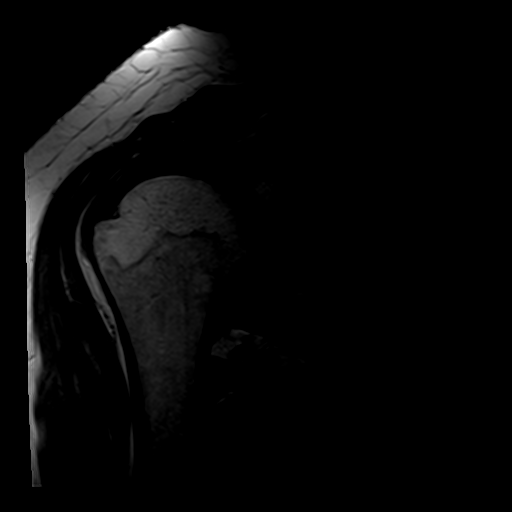
[im 15/21]
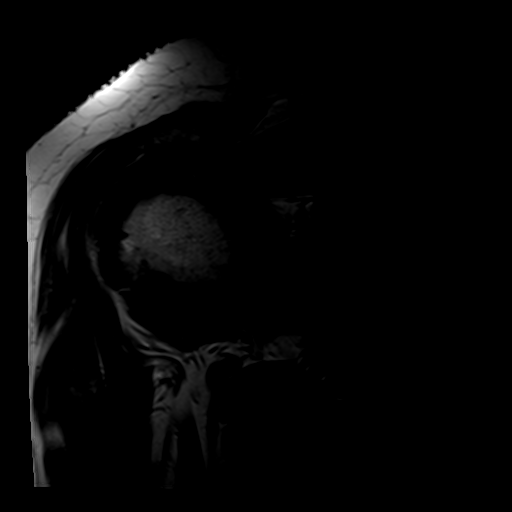
[im 18/21]
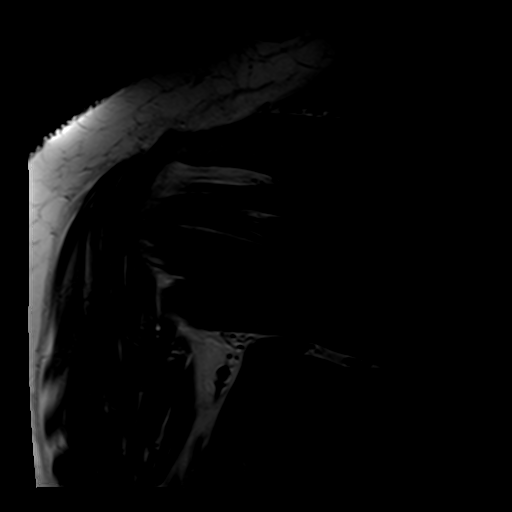
[im 21/21]
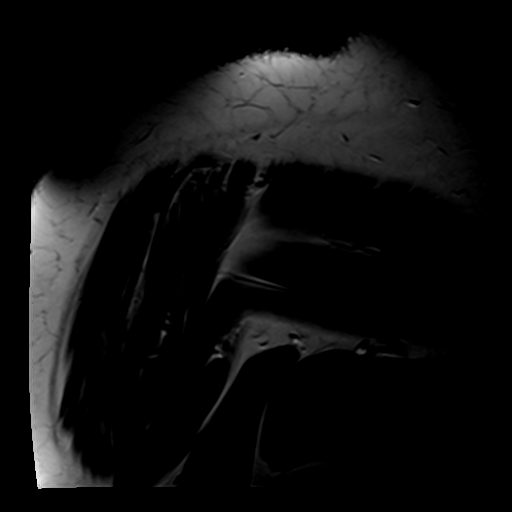

[Series 6: T2 fat-sat · oblique · 4.0mm · 0.59mm/px · 7 of 20 slices shown (3 of 3)]
[im 1/20]
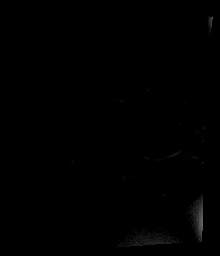
[im 4/20]
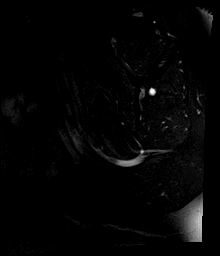
[im 7/20]
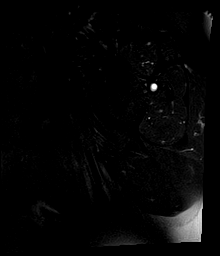
[im 10/20]
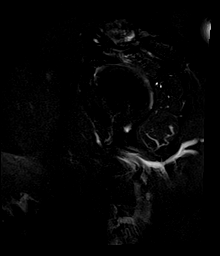
[im 13/20]
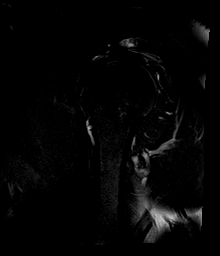
[im 16/20]
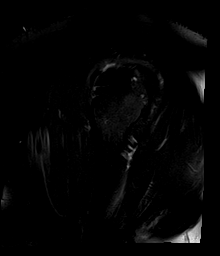
[im 20/20]
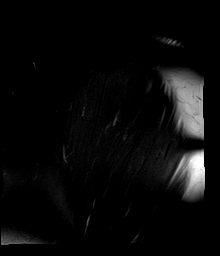

[Series 7: T1 · oblique · 4.0mm · 0.29mm/px · 4 of 20 slices shown]
[im 1/20]
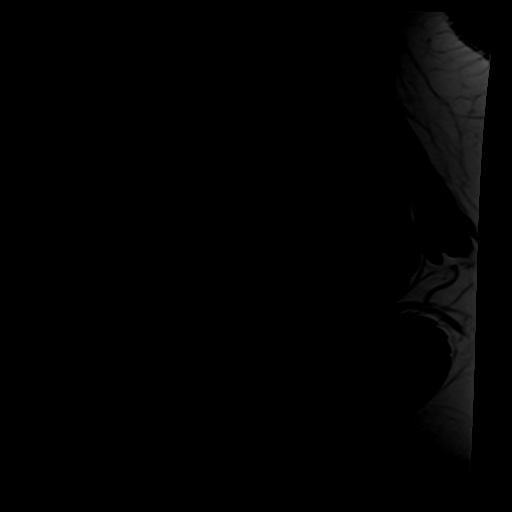
[im 4/20]
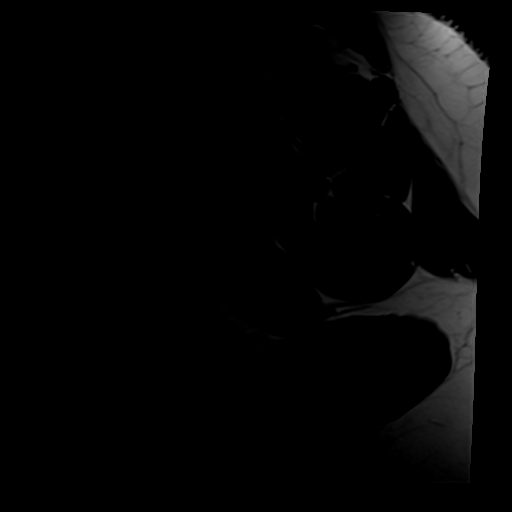
[im 7/20]
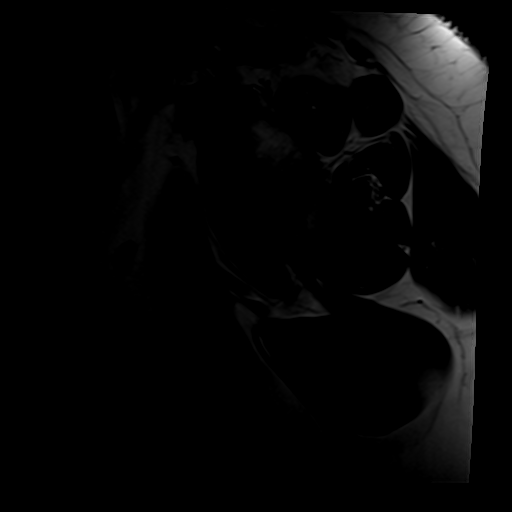
[im 10/20]
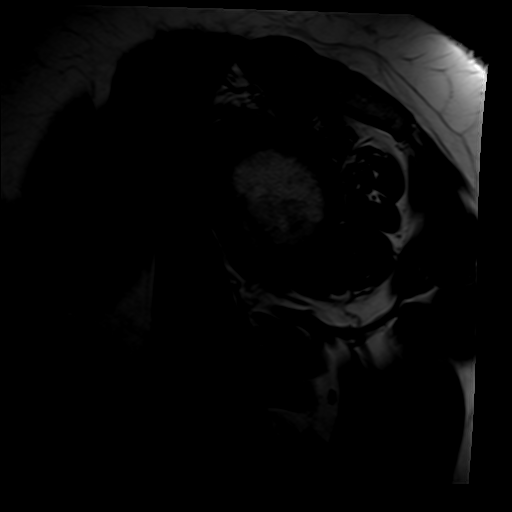

[35 of 40 positions shown; findings below may reference images not displayed]

FINDINGS: Rotator cuff: Moderate supraspinatus tendinosis with high-grade near
full-thickness bursal surface tear anteriorly at the insertion.
Intrasubstance tear of the posterior tendon at the insertion
extending into infraspinatus tendon. The subscapularis and teres
minor tendons are intact.

Muscles: No atrophy or abnormal signal of the muscles of the rotator
cuff. 0.5 x 2.6 x 0.8 cm ganglion cyst along the infraspinatus
myotendinous junction.

Biceps long head:  Intact and normally positioned.

Acromioclavicular Joint: Moderate arthropathy of the
acromioclavicular joint. Type II acromion. Small amount of fluid in
the subacromial/subdeltoid bursa.

Glenohumeral Joint: No joint effusion. No chondral defect.

Labrum: Grossly intact, but evaluation is limited by lack of
intraarticular fluid.

Bones: No acute fracture or dislocation. No suspicious bone lesion.

Other: None.
IMPRESSION: 1. Moderate supraspinatus tendinosis with high-grade near
full-thickness bursal surface tear anteriorly at the insertion.
2. Intrasubstance tear of the posterior supraspinatus tendon at the
insertion extending into the infraspinatus tendon. 2.6 cm ganglion
cyst along the infraspinatus myotendinous junction.
3. Moderate acromioclavicular osteoarthritis.
4. Mild subacromial/subdeltoid bursitis.

## 2022-06-17 ENCOUNTER — Other Ambulatory Visit: Payer: Self-pay | Admitting: Family Medicine

## 2022-10-20 ENCOUNTER — Other Ambulatory Visit: Payer: Self-pay | Admitting: Family Medicine

## 2022-10-20 DIAGNOSIS — R928 Other abnormal and inconclusive findings on diagnostic imaging of breast: Secondary | ICD-10-CM

## 2022-10-20 DIAGNOSIS — N6310 Unspecified lump in the right breast, unspecified quadrant: Secondary | ICD-10-CM

## 2022-10-27 ENCOUNTER — Ambulatory Visit (INDEPENDENT_AMBULATORY_CARE_PROVIDER_SITE_OTHER): Payer: 59 | Admitting: Internal Medicine

## 2022-10-27 ENCOUNTER — Encounter (INDEPENDENT_AMBULATORY_CARE_PROVIDER_SITE_OTHER): Payer: Self-pay | Admitting: Internal Medicine

## 2022-10-27 VITALS — BP 136/82 | HR 83 | Temp 98.2°F | Ht 66.0 in

## 2022-10-27 DIAGNOSIS — Z0289 Encounter for other administrative examinations: Secondary | ICD-10-CM

## 2022-10-27 DIAGNOSIS — Z6841 Body Mass Index (BMI) 40.0 and over, adult: Secondary | ICD-10-CM | POA: Diagnosis not present

## 2022-10-27 NOTE — Progress Notes (Signed)
Office: [phone]  /  Fax: [phone]   Initial Visit  Bailey Cooper was seen in clinic today to evaluate for obesity. She is interested in losing weight to improve overall health and reduce the risk of weight related complications. She presents today to review program treatment options, initial physical assessment, and evaluation.     She was referred by: PCP  When asked what else they would like to accomplish? She states: Adopt healthier eating patterns  When asked how has your weight affected you? She states: Contributed to orthopedic problems or mobility issues  Some associated conditions:Vitamin D Deficiency  Contributing factors: Family history, Stress, Reduced physical activity, Eating patterns, and Menopause  Weight promoting medications identified: None  Current nutrition plan: Other: cutting out some foods for religious reasons.   Current level of physical activity: Limited due to chronic pain or orthopedic problems  Current or previous pharmacotherapy: Phentermine  Response to medication: Lost weight initially but was unable to sustain weight loss   Past medical history includes:  History reviewed. No pertinent past medical history.   Objective:   BP 136/82   Pulse 83   Temp 98.2 F (36.8 C)   Ht 5' 6"$  (1.676 m)   SpO2 96%   BMI 39.22 kg/m  She was weighed on the bioimpedance scale: Body mass index is 39.22 kg/m.  Peak Weight: 267, Body Fat%: 49, Visceral Fat Rating: 16, Weight trend over the last 12 months: Unchanged  General:  Alert, oriented and cooperative. Patient is in no acute distress.  Respiratory: Normal respiratory effort, no problems with respiration noted  Extremities: Normal range of motion.    Mental Status: Normal mood and affect. Normal behavior. Normal judgment and thought content.   DIAGNOSTIC DATA REVIEWED:  BMET    Component Value Date/Time   NA 136 04/07/2022 1936   K 3.8 04/07/2022 1936   CL 103 04/07/2022 1936   CO2 25  04/07/2022 1936   GLUCOSE 91 04/07/2022 1936   BUN 14 04/07/2022 1936   CREATININE 1.22 (H) 04/07/2022 1936   CALCIUM 10.7 (H) 04/07/2022 1936   GFRNONAA 54 (L) 04/07/2022 1936   No results found for: "HGBA1C" No results found for: "INSULIN" CBC    Component Value Date/Time   WBC 4.2 04/07/2022 1936   RBC 5.23 (H) 04/07/2022 1936   HGB 13.8 04/07/2022 1936   HCT 43.3 04/07/2022 1936   PLT 211 04/07/2022 1936   MCV 82.8 04/07/2022 1936   MCH 26.4 04/07/2022 1936   MCHC 31.9 04/07/2022 1936   RDW 14.6 04/07/2022 1936   Iron/TIBC/Ferritin/ %Sat No results found for: "IRON", "TIBC", "FERRITIN", "IRONPCTSAT" Lipid Panel  No results found for: "CHOL", "TRIG", "HDL", "CHOLHDL", "VLDL", "LDLCALC", "LDLDIRECT" Hepatic Function Panel  No results found for: "PROT", "ALBUMIN", "AST", "ALT", "ALKPHOS", "BILITOT", "BILIDIR", "IBILI" No results found for: "TSH"   Assessment and Plan:   Class 3 severe obesity without serious comorbidity with body mass index (BMI) of 40.0 to 44.9 in adult, unspecified obesity type [facility]) Assessment & Plan: We reviewed weight, biometrics, associated medical conditions and contributing factors with patient. She would benefit from weight loss therapy via a modified calorie, low-carb, high-protein nutritional plan tailored to their REE (resting energy expenditure) which will be determined by indirect calorimetry.  We will also assess for cardiometabolic risk and nutritional derangements via fasting serologies at her next appointment.  I reviewed most recent labs at [facility] in [place].  She had a mildly elevated LDL cholesterol and normal  fasting blood glucose and kidney function.  She also had a normal A1c.         Obesity Treatment / Action Plan:  Patient will work on garnering support from family and friends to begin weight loss journey. Will work on eliminating or reducing the presence of highly palatable, calorie dense foods in the  home. Will complete provided nutritional and psychosocial assessment questionnaire before the next appointment. Will be scheduled for indirect calorimetry to determine resting energy expenditure in a fasting state.  This will allow [place] to create a reduced calorie, high-protein meal plan to promote loss of fat mass while preserving muscle mass. Counseled on the health benefits of losing 5%-15% of total body weight. Will work on improving sleep hygiene and trying to obtain at least 7 hours of sleep. Was counseled on nutritional approaches to weight loss and benefits of complex carbs and high quality protein as part of nutritional weight management.  Obesity Education Performed Today:  She was weighed on the bioimpedance scale and results were discussed and documented in the synopsis.  We discussed obesity as a disease and the importance of a more detailed evaluation of all the factors contributing to the disease.  We discussed the importance of long term lifestyle changes which include nutrition, exercise and behavioral modifications as well as the importance of customizing this to her specific health and social needs.  We discussed the benefits of reaching a healthier weight to alleviate the symptoms of existing conditions and reduce the risks of the biomechanical, metabolic and psychological effects of obesity.  Bailey Cooper appears to be in the action stage of change and states they are ready to start intensive lifestyle modifications and behavioral modifications.  30 minutes was spent today on this visit including the above counseling, pre-visit chart review, and post-visit documentation.  Reviewed by clinician on day of visit: allergies, medications, problem list, medical history, surgical history, family history, social history, and previous encounter notes.    I have reviewed the above documentation for accuracy and completeness, and I agree with the above.  Thomes Dinning, MD

## 2022-10-27 NOTE — Assessment & Plan Note (Signed)
We reviewed weight, biometrics, associated medical conditions and contributing factors with patient. She would benefit from weight loss therapy via a modified calorie, low-carb, high-protein nutritional plan tailored to their REE (resting energy expenditure) which will be determined by indirect calorimetry.  We will also assess for cardiometabolic risk and nutritional derangements via fasting serologies at her next appointment.  I reviewed most recent labs at [facility] in [place].  She had a mildly elevated LDL cholesterol and normal fasting blood glucose and kidney function.  She also had a normal A1c.

## 2022-10-27 NOTE — Progress Notes (Deleted)
Office: [phone]  /  Fax: [phone]  [place]  No data recorded  HPI  Chief Complaint: OBESITY  Chealsie is here to discuss her progress with her obesity treatment plan. She is on the {MWMwtlossportion/plan2:23431} and states she is following her eating plan approximately *** % of the time. She states she is exercising *** minutes *** times per week.   Interval History:  Since last office visit she ***   Pharmacotherapy: ***  PHYSICAL EXAM:  unknown if currently breastfeeding. There is no height or weight on file to calculate BMI.  General: She is overweight, cooperative, alert, well developed, and in no acute distress. PSYCH: Has normal mood, affect and thought process.   HEENT: EOMI, sclerae are anicteric. Lungs: Normal breathing effort, no conversational dyspnea. Extremities: No edema.  Neurologic: No gross sensory or motor deficits. No tremors or fasciculations noted.    DIAGNOSTIC DATA REVIEWED:  BMET    Component Value Date/Time   NA 136 04/07/2022 1936   K 3.8 04/07/2022 1936   CL 103 04/07/2022 1936   CO2 25 04/07/2022 1936   GLUCOSE 91 04/07/2022 1936   BUN 14 04/07/2022 1936   CREATININE 1.22 (H) 04/07/2022 1936   CALCIUM 10.7 (H) 04/07/2022 1936   GFRNONAA 54 (L) 04/07/2022 1936   No results found for: "HGBA1C" No results found for: "INSULIN" No results found for: "TSH" CBC    Component Value Date/Time   WBC 4.2 04/07/2022 1936   RBC 5.23 (H) 04/07/2022 1936   HGB 13.8 04/07/2022 1936   HCT 43.3 04/07/2022 1936   PLT 211 04/07/2022 1936   MCV 82.8 04/07/2022 1936   MCH 26.4 04/07/2022 1936   MCHC 31.9 04/07/2022 1936   RDW 14.6 04/07/2022 1936   Iron Studies No results found for: "IRON", "TIBC", "FERRITIN", "IRONPCTSAT" Lipid Panel  No results found for: "CHOL", "TRIG", "HDL", "CHOLHDL", "VLDL", "LDLCALC", "LDLDIRECT" Hepatic Function Panel  No results found for: "PROT", "ALBUMIN", "AST", "ALT", "ALKPHOS",  "BILITOT", "BILIDIR", "IBILI" No results found for: "TSH" Nutritional No results found for: "VD25OH"   ASSESSMENT AND PLAN  TREATMENT PLAN FOR OBESITY:  Recommended Dietary Goals  Preslynn is currently in the action stage of change. As such, her goal is to continue weight management plan. She has agreed to {MWMwtlossportion/plan2:23431}.  Behavioral Intervention  We discussed the following Behavioral Modification Strategies today: {EMWMwtlossstrategies:28914::"increasing lean protein intake","increasing vegetables","increase water intake","work on meal planning and easy cooking plans","think about ways to increase physical activity"}.  Additional resources provided today: NA  Recommended Physical Activity Goals  Ramsha has been advised to work up to 150 minutes of moderate intensity aerobic activity a week and strengthening exercises 2-3 times per week for cardiovascular health, weight loss maintenance and preservation of muscle mass.   She has agreed to {EMEXERCISE:28847::"increase physical activity in their day and reduce sedentary time (increase NEAT). "}   Pharmacotherapy We discussed various medication options to help Bahar with her weight loss efforts and we both agreed to ***.  ASSOCIATED CONDITIONS ADDRESSED TODAY  There are no diagnoses linked to this encounter.    No follow-ups on file.Marland Kitchen She was informed of the importance of frequent follow up visits to maximize her success with intensive lifestyle modifications for her multiple health conditions.   ATTESTASTION STATEMENTS:  Reviewed by clinician on day of visit: allergies, medications, problem list, medical history, surgical history, family history, social history, and previous encounter notes.   Time spent on visit including pre-visit chart review and post-visit  care and charting was *** minutes.    Thomes Dinning, MD

## 2022-11-02 ENCOUNTER — Ambulatory Visit
Admission: RE | Admit: 2022-11-02 | Discharge: 2022-11-02 | Disposition: A | Discharge status: Home / Self Care | Payer: Commercial Managed Care - HMO | Source: Ambulatory Visit | Attending: Family Medicine | Admitting: Family Medicine

## 2022-11-02 ENCOUNTER — Ambulatory Visit
Admission: RE | Admit: 2022-11-02 | Discharge: 2022-11-02 | Disposition: A | Discharge status: Home / Self Care | Payer: 59 | Source: Ambulatory Visit | Attending: Family Medicine | Admitting: Family Medicine

## 2022-11-02 DIAGNOSIS — N6310 Unspecified lump in the right breast, unspecified quadrant: Secondary | ICD-10-CM

## 2022-11-02 DIAGNOSIS — R928 Other abnormal and inconclusive findings on diagnostic imaging of breast: Secondary | ICD-10-CM

## 2022-11-02 DIAGNOSIS — F4322 Adjustment disorder with anxiety: Secondary | ICD-10-CM | POA: Diagnosis not present

## 2022-11-02 DIAGNOSIS — N6011 Diffuse cystic mastopathy of right breast: Secondary | ICD-10-CM | POA: Diagnosis not present

## 2022-11-24 ENCOUNTER — Encounter (INDEPENDENT_AMBULATORY_CARE_PROVIDER_SITE_OTHER): Payer: Self-pay | Admitting: Internal Medicine

## 2022-11-24 ENCOUNTER — Ambulatory Visit (INDEPENDENT_AMBULATORY_CARE_PROVIDER_SITE_OTHER): Payer: 59 | Admitting: Internal Medicine

## 2022-11-24 VITALS — BP 130/81 | HR 92 | Temp 98.3°F | Ht 66.0 in | Wt 259.0 lb

## 2022-11-24 DIAGNOSIS — R5383 Other fatigue: Secondary | ICD-10-CM | POA: Diagnosis not present

## 2022-11-24 DIAGNOSIS — R944 Abnormal results of kidney function studies: Secondary | ICD-10-CM | POA: Diagnosis not present

## 2022-11-24 DIAGNOSIS — R0602 Shortness of breath: Secondary | ICD-10-CM | POA: Diagnosis not present

## 2022-11-24 DIAGNOSIS — Z1331 Encounter for screening for depression: Secondary | ICD-10-CM

## 2022-11-24 DIAGNOSIS — R1310 Dysphagia, unspecified: Secondary | ICD-10-CM

## 2022-11-24 DIAGNOSIS — E559 Vitamin D deficiency, unspecified: Secondary | ICD-10-CM | POA: Diagnosis not present

## 2022-11-24 DIAGNOSIS — Z6841 Body Mass Index (BMI) 40.0 and over, adult: Secondary | ICD-10-CM | POA: Diagnosis not present

## 2022-11-24 DIAGNOSIS — Z9884 Bariatric surgery status: Secondary | ICD-10-CM | POA: Diagnosis not present

## 2022-11-24 NOTE — Assessment & Plan Note (Signed)
Per history.  Currently not on supplementation.  Will check vitamin D levels as associated with adiposity.

## 2022-11-24 NOTE — Assessment & Plan Note (Signed)
Patient is experiencing symptoms of dysphagia particularly around solids.  This affects her ability to eat certain consistencies.  She has not been seen by bariatric surgeon for reevaluation.  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  She is agreeable to going to [facility].

## 2022-11-24 NOTE — Progress Notes (Unsigned)
Chief Complaint:   OBESITY Bailey Cooper (MR# [id number]) is a 52 y.o. female who presents for evaluation and treatment of obesity and related comorbidities. Current BMI is Body mass index is 41.8 kg/m. Melanye has been struggling with her weight for many years and has been unsuccessful in either losing weight, maintaining weight loss, or reaching her healthy weight goal.  Sukhdeep is currently in the action stage of change and ready to dedicate time achieving and maintaining a healthier weight. Tryniti is interested in becoming our patient and working on intensive lifestyle modifications including (but not limited to) diet and exercise for weight loss.  Donyae's habits were reviewed today and are as follows: Her family eats meals together, she thinks her family will eat healthier with her, she struggles with family and or coworkers weight loss sabotage, her desired weight loss is 79 lbs, she has been heavy most of her life, she started gaining weight in her 59's, her heaviest weight ever was 267 pounds, she is a picky eater and doesn't like to eat healthier foods, she has significant food cravings issues, she snacks frequently in the evenings, she skips meals frequently, she is frequently drinking liquids with calories, she frequently makes poor food choices, and she struggles with emotional eating.  Depression Screen Ocie's Food and Mood (modified PHQ-9) score was 14.  Subjective:   1. Other fatigue Kamora admits to daytime somnolence and admits to waking up still tired. Patient has a history of symptoms of daytime fatigue and morning fatigue. Amabel generally gets 4 or 6 hours of sleep per night, and states that she has nightime awakenings. Snoring is present. Apneic episodes are not present. Epworth Sleepiness Score is 6.   2. SOB (shortness of breath) on exertion Merlinda notes increasing shortness of breath with exercising and seems to be worsening over time with weight gain. She notes getting  out of breath sooner with activity than she used to. This has not gotten worse recently. Aunisty denies shortness of breath at rest or orthopnea.  3. Hx of laparoscopic adjustable gastric banding Patient is experiencing symptoms of dysphagia particularly around solids.  This affects her ability to eat certain consistencies.  She has not been seen by bariatric surgeon for reevaluation.    4. Decreased GFR Upon review of recent labs we noted she had a GFR less than 60.  She was in the emergency room acutely ill and might have been dehydrated.  She does not have high blood pressure or diabetes.  There is no family history of kidney disease.  She denies use of nephrotoxins.    5. Vitamin D deficiency Per history.  Currently not on supplementation.  6. Dysphagia, unspecified type  Assessment/Plan:   1. Other fatigue Arriyana does feel that her weight is causing her energy to be lower than it should be. Fatigue may be related to obesity, depression or many other causes. Labs will be ordered, and in the meanwhile, Aislyn will focus on self care including making healthy food choices, increasing physical activity and focusing on stress reduction.  - EKG 12-Lead - Vitamin B12  2. SOB (shortness of breath) on exertion Kimblery does feel that she gets out of breath more easily that she used to when she exercises. Alexandre's shortness of breath appears to be obesity related and exercise induced. She has agreed to work on weight loss and gradually increase exercise to treat her exercise induced shortness of breath. Will continue to monitor closely.  3. Hx  of laparoscopic adjustable gastric banding I recommend she see a bariatric surgeon to check the functionality and exclude complications from a migrating band.  She is agreeable to going to [facility].  - Amb Referral to Bariatric Surgery  4. Decreased GFR Patient counseled on maintaining adequate hydration.  We will check renal parameters with  intake labs.  - CBC with Differential/Platelet - Comprehensive metabolic panel  5. Vitamin D deficiency Will check vitamin D levels as associated with adiposity.  - VITAMIN D 25 Hydroxy (Vit-D Deficiency, Fractures)  6. Dysphagia, unspecified type I recommend she see a bariatric surgeon to check the functionality and exclude complications from a migrating band.  She is agreeable to going to [facility].  - Amb Referral to Bariatric Surgery  7. Depression screen Rachella had a positive depression screening. Depression is commonly associated with obesity and often results in emotional eating behaviors. We will monitor this closely and work on CBT to help improve the non-hunger eating patterns. Referral to Psychology may be required if no improvement is seen as she continues in our clinic.  8. Class 3 severe obesity without serious comorbidity with body mass index (BMI) of 40.0 to 44.9 in adult, unspecified obesity type ([facility]) We will check labs today.   - Hemoglobin A1c - Insulin, random - TSH - Lipid Panel With LDL/HDL Ratio - Amb Referral to Bariatric Surgery  Saragrace is currently in the action stage of change and her goal is to continue with weight loss efforts. I recommend Tenise begin the structured treatment plan as follows:  She has agreed to the Category 2 Plan + 200 calories.  Exercise goals: No exercise has been prescribed at this time.   Behavioral modification strategies: increasing lean protein intake, decreasing simple carbohydrates, increasing vegetables, increasing water intake, decreasing liquid calories, increasing high fiber foods, decreasing eating out, no skipping meals, meal planning and cooking strategies, keeping healthy foods in the home, ways to avoid boredom eating, better snacking choices, avoiding temptations, and planning for success.  She was informed of the importance of frequent follow-up visits to maximize her success with intensive lifestyle  modifications for her multiple health conditions. She was informed we would discuss her lab results at her next visit unless there is a critical issue that needs to be addressed sooner. Hinata agreed to keep her next visit at the agreed upon time to discuss these results.  Objective:   Blood pressure 130/81, pulse 92, temperature 98.3 F (36.8 C), height 5\' 6"  (1.676 m), weight 259 lb (117.5 kg), SpO2 99 %, unknown if currently breastfeeding. Body mass index is 41.8 kg/m.  EKG: Normal sinus rhythm, rate 86 BPM.  Indirect Calorimeter completed today shows a VO2 of 275 and a REE of 1901.  Her calculated basal metabolic rate is [id number] thus her basal metabolic rate is unchanged than expected.  General: Cooperative, alert, well developed, in no acute distress. HEENT: Conjunctivae and lids unremarkable. Cardiovascular: Regular rhythm.  Lungs: Normal work of breathing. Neurologic: No focal deficits.   Lab Results  Component Value Date   CREATININE 0.73 11/24/2022   BUN 13 11/24/2022   NA 141 11/24/2022   K 4.1 11/24/2022   CL 105 11/24/2022   CO2 22 11/24/2022   Lab Results  Component Value Date   ALT 24 11/24/2022   AST 23 11/24/2022   ALKPHOS 98 11/24/2022   BILITOT 0.3 11/24/2022   Lab Results  Component Value Date   HGBA1C 5.5 11/24/2022   Lab  Results  Component Value Date   INSULIN 17.7 11/24/2022   Lab Results  Component Value Date   TSH 1.990 11/24/2022   Lab Results  Component Value Date   CHOL 183 11/24/2022   HDL 65 11/24/2022   LDLCALC 104 (H) 11/24/2022   TRIG 77 11/24/2022   Lab Results  Component Value Date   WBC 3.2 (L) 11/24/2022   HGB 12.9 11/24/2022   HCT 39.9 11/24/2022   MCV 82 11/24/2022   PLT 197 11/24/2022   No results found for: "IRON", "TIBC", "FERRITIN"  Attestation Statements:   Reviewed by clinician on day of visit: allergies, medications, problem list, medical history, surgical history, family history, social history, and previous  encounter notes.  Time spent on visit including pre-visit chart review and post-visit charting and care was 40 minutes.   Wilhemena Durie, am acting as transcriptionist for Thomes Dinning, MD.  I have reviewed the above documentation for accuracy and completeness, and I agree with the above. -Thomes Dinning, MD

## 2022-11-24 NOTE — Assessment & Plan Note (Signed)
Upon review of recent labs we noted she had a GFR less than 60.  She was in the emergency room acutely ill and might have been dehydrated.  She does not have high blood pressure or diabetes.  There is no family history of kidney disease.  She denies use of nephrotoxins.  Patient counseled on maintaining adequate hydration.  We will check renal parameters with intake labs.

## 2022-11-26 LAB — COMPREHENSIVE METABOLIC PANEL
ALT: 24 IU/L (ref 0–32)
AST: 23 IU/L (ref 0–40)
Albumin/Globulin Ratio: 1.8 (ref 1.2–2.2)
Albumin: 4.4 g/dL (ref 3.8–4.9)
Alkaline Phosphatase: 98 IU/L (ref 44–121)
BUN/Creatinine Ratio: 18 (ref 9–23)
BUN: 13 mg/dL (ref 6–24)
Bilirubin Total: 0.3 mg/dL (ref 0.0–1.2)
CO2: 22 mmol/L (ref 20–29)
Calcium: 10.1 mg/dL (ref 8.7–10.2)
Chloride: 105 mmol/L (ref 96–106)
Creatinine, Ser: 0.73 mg/dL (ref 0.57–1.00)
Globulin, Total: 2.5 g/dL (ref 1.5–4.5)
Glucose: 95 mg/dL (ref 70–99)
Potassium: 4.1 mmol/L (ref 3.5–5.2)
Sodium: 141 mmol/L (ref 134–144)
Total Protein: 6.9 g/dL (ref 6.0–8.5)
eGFR: 99 mL/min/{1.73_m2} (ref >59)

## 2022-11-26 LAB — LIPID PANEL WITH LDL/HDL RATIO
Cholesterol, Total: 183 mg/dL (ref 100–199)
HDL: 65 mg/dL (ref >39)
LDL Chol Calc (NIH): 104 mg/dL — ABNORMAL HIGH (ref 0–99)
LDL/HDL Ratio: 1.6 ratio (ref 0.0–3.2)
Triglycerides: 77 mg/dL (ref 0–149)
VLDL Cholesterol Cal: 14 mg/dL (ref 5–40)

## 2022-11-26 LAB — CBC WITH DIFFERENTIAL/PLATELET
Basophils Absolute: 0 10*3/uL (ref 0.0–0.2)
Basos: 1 %
EOS (ABSOLUTE): 0 10*3/uL (ref 0.0–0.4)
Eos: 1 %
Hematocrit: 39.9 % (ref 34.0–46.6)
Hemoglobin: 12.9 g/dL (ref 11.1–15.9)
Immature Grans (Abs): 0 10*3/uL (ref 0.0–0.1)
Immature Granulocytes: 0 %
Lymphocytes Absolute: 1.3 10*3/uL (ref 0.7–3.1)
Lymphs: 39 %
MCH: 26.6 pg (ref 26.6–33.0)
MCHC: 32.3 g/dL (ref 31.5–35.7)
MCV: 82 fL (ref 79–97)
Monocytes Absolute: 0.2 10*3/uL (ref 0.1–0.9)
Monocytes: 7 %
Neutrophils Absolute: 1.7 10*3/uL (ref 1.4–7.0)
Neutrophils: 52 %
Platelets: 197 10*3/uL (ref 150–450)
RBC: 4.85 x10E6/uL (ref 3.77–5.28)
RDW: 13.5 % (ref 11.7–15.4)
WBC: 3.2 10*3/uL — ABNORMAL LOW (ref 3.4–10.8)

## 2022-11-26 LAB — VITAMIN D 25 HYDROXY (VIT D DEFICIENCY, FRACTURES): Vit D, 25-Hydroxy: 35.9 ng/mL (ref 30.0–100.0)

## 2022-11-26 LAB — INSULIN, RANDOM: INSULIN: 17.7 u[IU]/mL (ref 2.6–24.9)

## 2022-11-26 LAB — VITAMIN B12: Vitamin B-12: 416 pg/mL (ref 232–1245)

## 2022-11-26 LAB — HEMOGLOBIN A1C
Est. average glucose Bld gHb Est-mCnc: 111 mg/dL
Hgb A1c MFr Bld: 5.5 % (ref 4.8–5.6)

## 2022-11-26 LAB — TSH: TSH: 1.99 u[IU]/mL (ref 0.450–4.500)

## 2022-11-30 DIAGNOSIS — F4322 Adjustment disorder with anxiety: Secondary | ICD-10-CM | POA: Diagnosis not present

## 2022-12-04 DIAGNOSIS — R1310 Dysphagia, unspecified: Secondary | ICD-10-CM | POA: Diagnosis not present

## 2022-12-04 DIAGNOSIS — Z9884 Bariatric surgery status: Secondary | ICD-10-CM | POA: Diagnosis not present

## 2022-12-06 ENCOUNTER — Encounter (HOSPITAL_BASED_OUTPATIENT_CLINIC_OR_DEPARTMENT_OTHER): Payer: Self-pay

## 2022-12-06 ENCOUNTER — Other Ambulatory Visit: Payer: Self-pay

## 2022-12-06 ENCOUNTER — Emergency Department (HOSPITAL_BASED_OUTPATIENT_CLINIC_OR_DEPARTMENT_OTHER)
Admission: EM | Admit: 2022-12-06 | Discharge: 2022-12-06 | Disposition: A | Discharge status: Home / Self Care | Payer: 59 | Attending: Emergency Medicine | Admitting: Emergency Medicine

## 2022-12-06 DIAGNOSIS — N3001 Acute cystitis with hematuria: Secondary | ICD-10-CM | POA: Diagnosis not present

## 2022-12-06 DIAGNOSIS — R1030 Lower abdominal pain, unspecified: Secondary | ICD-10-CM | POA: Diagnosis not present

## 2022-12-06 LAB — URINALYSIS, ROUTINE W REFLEX MICROSCOPIC
Bilirubin Urine: NEGATIVE
Glucose, UA: NEGATIVE mg/dL
Nitrite: NEGATIVE
Protein, ur: 30 mg/dL — AB
RBC / HPF: >50 RBC/hpf (ref 0–5)
Specific Gravity, Urine: 1.025 (ref 1.005–1.030)
WBC, UA: >50 WBC/hpf (ref 0–5)
pH: 6 (ref 5.0–8.0)

## 2022-12-06 MED ORDER — PHENAZOPYRIDINE HCL 200 MG PO TABS: 200.0000 mg | ORAL_TABLET | Freq: Three times a day (TID) | ORAL | 0 refills | Status: DC | PRN

## 2022-12-06 MED ORDER — CIPROFLOXACIN HCL 500 MG PO TABS: 500.0000 mg | ORAL_TABLET | Freq: Two times a day (BID) | ORAL | 0 refills | Status: AC

## 2022-12-06 MED ORDER — CIPROFLOXACIN HCL 500 MG PO TABS
500.0000 mg | ORAL_TABLET | Freq: Once | ORAL | Status: AC
  Administered 2022-12-06: 500 mg via ORAL
  Filled 2022-12-06: qty 1

## 2022-12-06 NOTE — ED Provider Notes (Signed)
[facility] Provider Note   CSN: [id number] Arrival date & time: 12/06/22  0023     History  Chief Complaint  Patient presents with   Dysuria    Earldine Cooper is a 52 y.o. female.  With PMH of obesity, GERD who presents with lower abdominal pain and urinary symptoms.  Patient has had 1 to 2 days of lower abdominal pain associate with bladder pressure, pain with urination especially at end of urination associate with some makes blood in urine.  She has had no flank pain or back pain.  No fevers, no chills, no vomiting.  Has had some mild nausea with it.  Has not been taking any antibiotics.  Has had previous UTIs in the past and this is similar.  She has had previous blood in the urine.  No history of kidney stones.  She no longer gets menstrual periods.   Dysuria      Home Medications Prior to Admission medications   Medication Sig Start Date End Date Taking? Authorizing Provider  ciprofloxacin (CIPRO) 500 MG tablet Take 1 tablet (500 mg total) by mouth every 12 (twelve) hours for 7 days. 12/06/22 12/13/22 Yes Elgie Congo, MD  phenazopyridine (PYRIDIUM) 200 MG tablet Take 1 tablet (200 mg total) by mouth 3 (three) times daily as needed for pain (burning urination). 12/06/22  Yes Elgie Congo, MD  loratadine-pseudoephedrine (CLARITIN-D 12-HOUR) 5-120 MG tablet Take 1 tablet by mouth 2 (two) times daily. As needed    [provider]      Allergies    Cherry and Penicillins    Review of Systems   Review of Systems  Genitourinary:  Positive for dysuria.    Physical Exam Updated Vital Signs BP (!) 143/82   Pulse 92   Temp 98.1 F (36.7 C) (Oral)   Resp 18   Ht 5\' 6"  (1.676 m)   Wt 117 kg   SpO2 99%   BMI 41.64 kg/m  Physical Exam Constitutional: Alert and oriented. Well appearing and in no distress. Eyes: Conjunctivae are normal. ENT      Mouth/Throat: Mucous membranes are moist Cardiovascular: S1, S2,   Normal and symmetric distal pulses are present in all extremities.Warm and well perfused. Respiratory: Normal respiratory effort. Breath sounds are normal. Gastrointestinal: Soft and mild suprapubic tenderness no rebound or guarding, no CVA tenderness Musculoskeletal: Normal range of motion in all extremities. Neurologic: Normal speech and language. Skin: Skin is warm, dry and intact. No rash noted. Psychiatric: Mood and affect are normal. Speech and behavior are normal.  ED Results / Procedures / Treatments   Labs (all labs ordered are listed, but only abnormal results are displayed) Labs Reviewed  URINALYSIS, ROUTINE W REFLEX MICROSCOPIC - Abnormal; Notable for the following components:      Result Value   Color, Urine ORANGE (*)    APPearance CLOUDY (*)    Hgb urine dipstick LARGE (*)    Ketones, ur TRACE (*)    Protein, ur 30 (*)    Leukocytes,Ua LARGE (*)    Bacteria, UA FEW (*)    Non Squamous Epithelial 0-5 (*)    All other components within normal limits    EKG None  Radiology No results found.  Procedures Procedures    Medications Ordered in ED Medications  ciprofloxacin (CIPRO) tablet 500 mg (has no administration in time range)    ED Course/ Medical Decision Making/ A&P   {    Medical Decision  Making  Bailey Cooper is a 52 y.o. female.  With PMH of obesity, GERD who presents with lower abdominal pain and urinary symptoms.    UA reviewed by me concerning for cystitis with hematuria.  This is consistent with her symptoms today.  There is large leukocyte Estrace greater than 50 RBCs and WBCs with few bacteria.  She is hemodynamically stable and afebrile, I am not concerned for sepsis.  She has no CVA tenderness and no symptoms suggestive of nephrolithiasis.  Did offer imaging such as CT renal stone study however patient declined at this time.  We discussed management of cystitis with antibiotics with ciprofloxacin due to allergies and if no improvement following  up with urology or coming back to ED.  She is in agreement with plan and discharged in good condition.  Amount and/or Complexity of Data Reviewed Labs: ordered.  Risk Prescription drug management.    Final Clinical Impression(s) / ED Diagnoses Final diagnoses:  Acute cystitis with hematuria    Rx / DC Orders ED Discharge Orders          Ordered    ciprofloxacin (CIPRO) 500 MG tablet  Every 12 hours        12/06/22 0137    phenazopyridine (PYRIDIUM) 200 MG tablet  3 times daily PRN        12/06/22 0137    Ambulatory referral to Urology        12/06/22 0139              Elgie Congo, MD 12/06/22 [phone]

## 2022-12-06 NOTE — ED Triage Notes (Signed)
POV from home, A&O x 4, GCS 15  Pt sts that this am she began having nausea with painful urination, bladder/lower abd pressure and now has blood in urine.

## 2022-12-06 NOTE — Discharge Instructions (Signed)
You have been seen in the Emergency Department (ED) today and your workup today suggests that you have a a urinary tract infection (UTI).  You have been prescribed an antibiotic called ciprofloxacin. Take this medication as prescribed and do not miss any doses. You may use over-the-counter pain medication (Tylenol or Motrin) as needed, but no more than recommended on the label instructions.  You can take the Pyridium as needed for burning urination pain.  Drink PLENTY of fluids.  Call your regular doctor to schedule the next available appointment (ideally within one week) to follow up on today's ED visit.  I have also included a urology referral if the blood in your urine does not improve with treatment of infection.  Make an appointment with urologist if it does not improve.  Return immediately to the ED if your pain worsens, you develop back pain or you have decreased urine production, develop persistent fever >100.4, persistent vomiting such that you can't hold down your antibiotics, or other symptoms that concern you.

## 2022-12-08 ENCOUNTER — Encounter (INDEPENDENT_AMBULATORY_CARE_PROVIDER_SITE_OTHER): Payer: Self-pay

## 2022-12-08 ENCOUNTER — Ambulatory Visit (INDEPENDENT_AMBULATORY_CARE_PROVIDER_SITE_OTHER): Payer: 59 | Admitting: Internal Medicine

## 2022-12-09 ENCOUNTER — Ambulatory Visit (INDEPENDENT_AMBULATORY_CARE_PROVIDER_SITE_OTHER): Payer: 59 | Admitting: Internal Medicine

## 2022-12-09 ENCOUNTER — Encounter (INDEPENDENT_AMBULATORY_CARE_PROVIDER_SITE_OTHER): Payer: Self-pay | Admitting: Internal Medicine

## 2022-12-09 VITALS — BP 130/87 | HR 80 | Temp 98.4°F | Ht 66.0 in | Wt 256.0 lb

## 2022-12-09 DIAGNOSIS — E559 Vitamin D deficiency, unspecified: Secondary | ICD-10-CM | POA: Diagnosis not present

## 2022-12-09 DIAGNOSIS — Z6841 Body Mass Index (BMI) 40.0 and over, adult: Secondary | ICD-10-CM

## 2022-12-09 DIAGNOSIS — E88819 Insulin resistance, unspecified: Secondary | ICD-10-CM | POA: Diagnosis not present

## 2022-12-09 DIAGNOSIS — D708 Other neutropenia: Secondary | ICD-10-CM | POA: Diagnosis not present

## 2022-12-09 DIAGNOSIS — Z9884 Bariatric surgery status: Secondary | ICD-10-CM

## 2022-12-09 DIAGNOSIS — R944 Abnormal results of kidney function studies: Secondary | ICD-10-CM

## 2022-12-09 NOTE — Assessment & Plan Note (Signed)
I reviewed most recent labs with patient in detail.  She has a low WBC but normal differential.  This is consistent with ethnic neutropenia patient was counseled that this is a benign finding and has no effect on immune function.

## 2022-12-09 NOTE — Assessment & Plan Note (Signed)
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 Optimal level < 1.9. This is complex condition associated with genetics, ectopic fat and lifestyle factors. Insulin resistance may result in weight gain, abnormal cravings (particularly for carbs) and fatigue. This may result in additional weight gain and lead to pre-diabetes and diabetes if untreated.   Lab Results  Component Value Date   HGBA1C 5.5 11/24/2022   Lab Results  Component Value Date   INSULIN 17.7 11/24/2022   Lab Results  Component Value Date   GLUCOSE 95 11/24/2022   GLUCOSE 91 04/07/2022    We reviewed treatment options which include losing 7 to 10% of body weight, increasing physical activity to a 150 minutes a week of moderate intensity.She may also be a candidate for pharmacoprophylaxis with metformin or incretin mimetic.

## 2022-12-09 NOTE — Progress Notes (Addendum)
Office: [phone]  /  Fax: [phone]  WEIGHT SUMMARY AND BIOMETRICS  Vitals Temp: 98.4 F (36.9 C) BP: 130/87 Pulse Rate: 80 SpO2: 100 %   Anthropometric Measurements Height: 5\' 6"  (1.676 m) Weight: 256 lb (116.1 kg) BMI (Calculated): 41.34 Weight at Last Visit: 259 lb Weight Lost Since Last Visit: 3 lb Starting Weight: 259 lb Total Weight Loss (lbs): 3 lb (1.361 kg) Peak Weight: 267   Body Composition  Body Fat %: 46.8 % Fat Mass (lbs): 120 lbs Muscle Mass (lbs): 129.6 lbs Total Body Water (lbs): 93.4 lbs   The 10-year ASCVD risk score (Arnett DK, et al., 2019) is: 1.9%  HPI  Chief Complaint: OBESITY  Bailey Cooper is here to discuss her progress with her obesity treatment plan. She is on the the Category 2 Plan + 200 calories and states she is following her eating plan approximately 0 % of the time. She states she is not exercising.  Interval History:  Since last office visit she has lost 3 pounds.  She thinks is due to recent illness she has been sick over the last 2 weeks. She has been working on reading food labels, not skipping meals, increasing protein at every meal, eating more vegetables, drinking more water, avoiding and or reducing liquid calories, making healthier choices, and working on gradual implementation of reduced calorie nutrition plan.  We had referred her to bariatric surgery because of problems with Lap-Band and sense of restriction when eating solids. [x] Denies [] Reports problems with appetite and hunger signals.  [x] Denies [] Reports problems with satiety and satiation.  [x] Denies [] Reports problems with eating patterns and portion control.  [x] Denies [] Reports abnormal cravings   Barriers identified having difficulty with meal prep and planning, frequent travel, and long work hours.   Pharmacotherapy for weight loss: She is currently taking no anti-obesity medication.    ASSESSMENT AND PLAN  TREATMENT PLAN FOR OBESITY:  Recommended  Dietary Goals  Bailey Cooper is currently in the action stage of change. As such, her goal is to continue weight management plan. She has agreed to: Work on gradually implementing reduced calorie nutrition plan.  Behavioral Intervention  We discussed the following Behavioral Modification Strategies today: increasing lean protein intake, decreasing simple carbohydrates , increasing vegetables, avoiding skipping meals, increasing water intake, work on meal planning and easy cooking plans, planning for success, and keeping healthy foods at home.  Additional resources provided today: None  Recommended Physical Activity Goals  Bailey Cooper has been advised to work up to 150 minutes of moderate intensity aerobic activity a week and strengthening exercises 2-3 times per week for cardiovascular health, weight loss maintenance and preservation of muscle mass.   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 Optimal level < 1.9. This is complex condition associated with genetics, ectopic fat and lifestyle factors. Insulin resistance may result in weight gain, abnormal cravings (particularly for carbs) and fatigue. This may result in additional weight gain and lead to pre-diabetes and diabetes if untreated.   Lab Results  Component Value Date   HGBA1C 5.5 11/24/2022   Lab  Results  Component Value Date   INSULIN 17.7 11/24/2022   Lab Results  Component Value Date   GLUCOSE 95 11/24/2022   GLUCOSE 91 04/07/2022    We reviewed treatment options which include  losing 7 to 10% of body weight, increasing physical activity to a 150 minutes a week of moderate intensity.She may also be a candidate for pharmacoprophylaxis with metformin or incretin mimetic.     Hx of laparoscopic adjustable gastric banding Assessment & Plan: Patient is experiencing symptoms of dysphagia particularly around solids.  This affects her ability to eat certain consistencies.  I had recommended she see a bariatric surgeon to check the functionality and exclude complications from a migrating band.  She was seen by them and was given 2 options per patient 1 is to remove the Lap-Band and to was to convert to a gastric sleeve.  She is hesitant about having gastric sleeve surgery and is interested in having Lap-Band removed.   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  She has a low WBC but normal differential.  This is consistent with ethnic neutropenia patient was counseled that this is a benign finding and has no effect on immune function.   Vitamin D deficiency Assessment & Plan: Most recent vitamin D levels have improved Lab Results  Component Value Date   VD25OH 35.9 11/24/2022     Deficiency state associated with adiposity and may result in leptin resistance, weight gain and fatigue.  Plan: Increase vitamin D3 to 2000 international units daily over-the-counter.    Decreased GFR Assessment & Plan: I reviewed most recent GFR which shows an improvement.  She will continue to work on improving hydration and avoiding nephrotoxins.  I also recommend that she monitor her blood pressure for goal of less than 120/80.      PHYSICAL EXAM:  Blood pressure 130/87, pulse 80, temperature 98.4 F (36.9 C), height 5\' 6"  (1.676 m), weight 256 lb (116.1 kg), SpO2 100 %, unknown if currently breastfeeding. Body mass index is 41.32 kg/m.  General: She is overweight, cooperative, alert, well developed, and in no acute  distress. PSYCH: Has normal mood, affect and thought process.   HEENT: EOMI, sclerae are anicteric. Lungs: Normal breathing effort, no conversational dyspnea. Extremities: No edema.  Neurologic: No gross sensory or motor deficits. No tremors or fasciculations noted.    DIAGNOSTIC DATA REVIEWED:  BMET    Component Value Date/Time   NA 141 11/24/2022 1225   K 4.1 11/24/2022 1225   CL 105 11/24/2022 1225   CO2 22 11/24/2022 1225   GLUCOSE 95 11/24/2022 1225   GLUCOSE 91 04/07/2022 1936   BUN 13 11/24/2022 1225   CREATININE 0.73 11/24/2022 1225   CALCIUM 10.1 11/24/2022 1225   GFRNONAA 54 (L) 04/07/2022 1936   Lab Results  Component Value Date   HGBA1C 5.5 11/24/2022   Lab Results  Component Value Date   INSULIN 17.7 11/24/2022   Lab Results  Component Value Date   TSH 1.990 11/24/2022   CBC    Component Value Date/Time   WBC 3.2 (L) 11/24/2022 1225   WBC 4.2 04/07/2022 1936   RBC 4.85 11/24/2022 1225   RBC 5.23 (H) 04/07/2022 1936   HGB 12.9 11/24/2022 1225   HCT 39.9 11/24/2022 1225   PLT 197 11/24/2022 1225   MCV 82 11/24/2022 1225   MCH 26.6 11/24/2022 1225   MCH 26.4 04/07/2022 1936   MCHC 32.3 11/24/2022 1225  MCHC 31.9 04/07/2022 1936   RDW 13.5 11/24/2022 1225   Iron Studies No results found for: "IRON", "TIBC", "FERRITIN", "IRONPCTSAT" Lipid Panel     Component Value Date/Time   CHOL 183 11/24/2022 1225   TRIG 77 11/24/2022 1225   HDL 65 11/24/2022 1225   LDLCALC 104 (H) 11/24/2022 1225   Hepatic Function Panel     Component Value Date/Time   PROT 6.9 11/24/2022 1225   ALBUMIN 4.4 11/24/2022 1225   AST 23 11/24/2022 1225   ALT 24 11/24/2022 1225   ALKPHOS 98 11/24/2022 1225   BILITOT 0.3 11/24/2022 1225      Component Value Date/Time   TSH 1.990 11/24/2022 1225   Nutritional Lab Results  Component Value Date   VD25OH 35.9 11/24/2022     Return in about 2 weeks (around 12/23/2022) for For Weight Mangement with Dr. Gerarda Fraction.Marland Kitchen She  was informed of the importance of frequent follow up visits to maximize her success with intensive lifestyle modifications for her multiple health conditions.   ATTESTASTION STATEMENTS:  Reviewed by clinician on day of visit: allergies, medications, problem list, medical history, surgical history, family history, social history, and previous encounter notes.     Thomes Dinning, MD

## 2022-12-09 NOTE — Assessment & Plan Note (Signed)
Patient is experiencing symptoms of dysphagia particularly around solids.  This affects her ability to eat certain consistencies.  I had recommended she see a bariatric surgeon to check the functionality and exclude complications from a migrating band.  She was seen by them and was given 2 options per patient 1 is to remove the Lap-Band and to was to convert to a gastric sleeve.  She is hesitant about having gastric sleeve surgery and is interested in having Lap-Band removed.

## 2022-12-09 NOTE — Assessment & Plan Note (Signed)
I reviewed most recent GFR which shows an improvement.  She will continue to work on improving hydration and avoiding nephrotoxins.  I also recommend that she monitor her blood pressure for goal of less than 120/80.

## 2022-12-09 NOTE — Assessment & Plan Note (Signed)
Most recent vitamin D levels have improved Lab Results  Component Value Date   VD25OH 35.9 11/24/2022     Deficiency state associated with adiposity and may result in leptin resistance, weight gain and fatigue.  Plan: Increase vitamin D3 to 2000 international units daily over-the-counter.

## 2022-12-23 ENCOUNTER — Ambulatory Visit (INDEPENDENT_AMBULATORY_CARE_PROVIDER_SITE_OTHER): Payer: 59 | Admitting: Family Medicine

## 2022-12-23 ENCOUNTER — Ambulatory Visit (INDEPENDENT_AMBULATORY_CARE_PROVIDER_SITE_OTHER): Payer: 59 | Admitting: Internal Medicine

## 2022-12-23 ENCOUNTER — Encounter (INDEPENDENT_AMBULATORY_CARE_PROVIDER_SITE_OTHER): Payer: Self-pay

## 2022-12-23 ENCOUNTER — Encounter (INDEPENDENT_AMBULATORY_CARE_PROVIDER_SITE_OTHER): Payer: Self-pay | Admitting: Family Medicine

## 2022-12-23 VITALS — BP 127/75 | HR 89 | Temp 98.0°F | Ht 66.0 in | Wt 258.0 lb

## 2022-12-23 DIAGNOSIS — F3289 Other specified depressive episodes: Secondary | ICD-10-CM | POA: Diagnosis not present

## 2022-12-23 DIAGNOSIS — F32A Depression, unspecified: Secondary | ICD-10-CM | POA: Insufficient documentation

## 2022-12-23 DIAGNOSIS — Z6841 Body Mass Index (BMI) 40.0 and over, adult: Secondary | ICD-10-CM | POA: Insufficient documentation

## 2022-12-23 DIAGNOSIS — R1319 Other dysphagia: Secondary | ICD-10-CM | POA: Diagnosis not present

## 2022-12-23 DIAGNOSIS — E669 Obesity, unspecified: Secondary | ICD-10-CM | POA: Diagnosis not present

## 2022-12-23 MED ORDER — BUPROPION HCL ER (SR) 150 MG PO TB12: 150.0000 mg | ORAL_TABLET | Freq: Every day | ORAL | 0 refills | Status: DC

## 2022-12-24 ENCOUNTER — Ambulatory Visit (INDEPENDENT_AMBULATORY_CARE_PROVIDER_SITE_OTHER): Payer: 59 | Admitting: Adult Health

## 2022-12-28 DIAGNOSIS — F4322 Adjustment disorder with anxiety: Secondary | ICD-10-CM | POA: Diagnosis not present

## 2022-12-28 NOTE — Progress Notes (Unsigned)
Chief Complaint:   OBESITY Bailey Cooper is here to discuss her progress with her obesity treatment plan along with follow-up of her obesity related diagnoses. Bailey Cooper is on keeping a food journal and adhering to recommended goals of 1400 calories and 100+ grams of protein and states she is following her eating plan approximately 20% of the time. Bailey Cooper states she is doing 0 minutes 0 times per week.  Today's visit was #: 3 Starting weight: 259 lbs Starting date: 11/24/2022 Today's weight: 258 lbs Today's date: 12/23/2022 Total lbs lost to date: 1 Total lbs lost since last in-office visit: 0  Interim History: Bailey Cooper is struggling to follow her plan.  Her protein is low due to issues with dysphagia.  She is retaining some fluid today.  She especially struggles with p.m. snacking.  Subjective:   1. Other dysphagia Bailey Cooper is status post lap band placement and she has dysphagia, and she is in the process of having the band removed.  She is vomiting approximately once per week.  She notes her first food of the day often gets "stuck".  2. Emotional Eating Behavior Bailey Cooper is struggling with increased p.m. snacking, especially as she stays up late.  Assessment/Plan:   1. Other dysphagia Bailey Cooper is working with [facility] surgery for band removal.  We discussed the pros and cons of converting to gastric sleeve.  She will consider her options.  2. Emotional Eating Behavior Bailey Cooper agreed to start Wellbutrin SR 150 mg every morning with no refills.  We will follow-up at her next visit in 2 weeks.  - buPROPion (WELLBUTRIN SR) 150 MG 12 hr tablet; Take 1 tablet (150 mg total) by mouth daily.  Dispense: 30 tablet; Refill: 0  3. BMI 40.0-44.9, adult  4. Obesity, Beginning BMI 41.8 Bailey Cooper is currently in the action stage of change. As such, her goal is to continue with weight loss efforts. She has agreed to keeping a food journal and adhering to recommended goals of 1400 calories and 100+ grams of  protein daily.   Behavioral modification strategies: increasing lean protein intake.  Bailey Cooper has agreed to follow-up with our clinic in 2 weeks. She was informed of the importance of frequent follow-up visits to maximize her success with intensive lifestyle modifications for her multiple health conditions.   Objective:   Blood pressure 127/75, pulse 89, temperature 98 F (36.7 C), height 5\' 6"  (1.676 m), weight 258 lb (117 kg), SpO2 98 %, unknown if currently breastfeeding. Body mass index is 41.64 kg/m.  Lab Results  Component Value Date   CREATININE 0.73 11/24/2022   BUN 13 11/24/2022   NA 141 11/24/2022   K 4.1 11/24/2022   CL 105 11/24/2022   CO2 22 11/24/2022   Lab Results  Component Value Date   ALT 24 11/24/2022   AST 23 11/24/2022   ALKPHOS 98 11/24/2022   BILITOT 0.3 11/24/2022   Lab Results  Component Value Date   HGBA1C 5.5 11/24/2022   Lab Results  Component Value Date   INSULIN 17.7 11/24/2022   Lab Results  Component Value Date   TSH 1.990 11/24/2022   Lab Results  Component Value Date   CHOL 183 11/24/2022   HDL 65 11/24/2022   LDLCALC 104 (H) 11/24/2022   TRIG 77 11/24/2022   Lab Results  Component Value Date   VD25OH 35.9 11/24/2022   Lab Results  Component Value Date   WBC 3.2 (L) 11/24/2022   HGB 12.9 11/24/2022   HCT  39.9 11/24/2022   MCV 82 11/24/2022   PLT 197 11/24/2022   No results found for: "IRON", "TIBC", "FERRITIN"  Attestation Statements:   Reviewed by clinician on day of visit: allergies, medications, problem list, medical history, surgical history, family history, social history, and previous encounter notes.  I have personally spent 40 minutes total time today in preparation, patient care, and documentation for this visit, including the following: review of clinical lab tests; review of medical tests/procedures/services.  I, Burt Knack, am acting as transcriptionist for Quillian Quince, MD.  I have reviewed the  above documentation for accuracy and completeness, and I agree with the above. -  Quillian Quince, MD

## 2023-01-06 ENCOUNTER — Encounter (INDEPENDENT_AMBULATORY_CARE_PROVIDER_SITE_OTHER): Payer: Self-pay | Admitting: Family Medicine

## 2023-01-06 ENCOUNTER — Ambulatory Visit (INDEPENDENT_AMBULATORY_CARE_PROVIDER_SITE_OTHER): Payer: 59 | Admitting: Family Medicine

## 2023-01-06 VITALS — BP 123/78 | HR 81 | Temp 98.3°F | Ht 66.0 in | Wt 259.0 lb

## 2023-01-06 DIAGNOSIS — R1319 Other dysphagia: Secondary | ICD-10-CM

## 2023-01-06 DIAGNOSIS — E669 Obesity, unspecified: Secondary | ICD-10-CM | POA: Diagnosis not present

## 2023-01-06 DIAGNOSIS — F3289 Other specified depressive episodes: Secondary | ICD-10-CM

## 2023-01-06 DIAGNOSIS — Z6841 Body Mass Index (BMI) 40.0 and over, adult: Secondary | ICD-10-CM

## 2023-01-06 MED ORDER — BUPROPION HCL ER (SR) 150 MG PO TB12: 150.0000 mg | ORAL_TABLET | Freq: Every day | ORAL | 0 refills | Status: DC

## 2023-01-06 NOTE — Progress Notes (Unsigned)
Chief Complaint:   OBESITY Bailey Cooper is here to discuss her progress with her obesity treatment plan along with follow-up of her obesity related diagnoses. Bailey Cooper is on keeping a food journal and adhering to recommended goals of 1400 calories and 100+ grams of protein and states she is following her eating plan approximately 60% of the time. [name] states she is doing 0 minutes 0 times per week.  Today's visit was #: 4 Starting weight: 259 lbs Starting date: 11/24/2022 Today's weight: 259 lbs Today's date: 01/06/2023 Total lbs lost to date: 0 Total lbs lost since last in-office visit: 0  Interim History: Bailey Cooper was on vacation between now and her last visit. She gained 6 lbs but she has lost 5 lbs right away. She is still struggling with her band being too tight.   Subjective:   1. Other dysphagia [name] is still deciding on having weight loss surgery revision. She has no fluid in her but still vomits regularly.   2. Emotional Eating Behavior Daziyah feels she is doing better with decreasing emotional eating behaviors on Wellbutrin. She denies worsening insomnia and her blood pressure is not elevated.   Assessment/Plan:   1. Other dysphagia [name] to contact [facility] and hopefully she will decide soon. We will continue to monitor, and she is ok to use protein shakes in the meantime.   2. Emotional Eating Behavior Dalisha will continue Wellbutrin SR, and we will refill for 1 month.   - buPROPion (WELLBUTRIN SR) 150 MG 12 hr tablet; Take 1 tablet (150 mg total) by mouth daily.  Dispense: 30 tablet; Refill: 0  3. BMI 40.0-44.9, adult  4. Obesity, Beginning BMI 41.8 Bailey Cooper is currently in the action stage of change. As such, her goal is to continue with weight loss efforts. She has agreed to keeping a food journal and adhering to recommended goals of 1200-1500 calories and 80+ grams of protein daily.   Behavioral modification strategies: increasing lean protein intake, meal  planning and cooking strategies, and keeping a strict food journal.  [name] has agreed to follow-up with our clinic in 2 weeks. She was informed of the importance of frequent follow-up visits to maximize her success with intensive lifestyle modifications for her multiple health conditions.   Objective:   Blood pressure 123/78, pulse 81, temperature 98.3 F (36.8 C), height  (1.676 m), weight 259 lb (117.5 kg), SpO2 99 %, unknown if currently breastfeeding. Body mass index is 41.8 kg/m.  Lab Results  Component Value Date   CREATININE 0.73 11/24/2022   BUN 13 11/24/2022   NA 141 11/24/2022   K 4.1 11/24/2022   CL 105 11/24/2022   CO2 22 11/24/2022   Lab Results  Component Value Date   ALT 24 11/24/2022   AST 23 11/24/2022   ALKPHOS 98 11/24/2022   BILITOT 0.3 11/24/2022   Lab Results  Component Value Date   HGBA1C 5.5 11/24/2022   Lab Results  Component Value Date   INSULIN 17.7 11/24/2022   Lab Results  Component Value Date   TSH 1.990 11/24/2022   Lab Results  Component Value Date   CHOL 183 11/24/2022   HDL 65 11/24/2022   LDLCALC 104 (H) 11/24/2022   TRIG 77 11/24/2022   Lab Results  Component Value Date   VD25OH 35.9 11/24/2022   Lab Results  Component Value Date   WBC 3.2 (L) 11/24/2022   HGB 12.9 11/24/2022   HCT 39.9 11/24/2022   MCV 82  11/24/2022   PLT 197 11/24/2022   No results found for: "IRON", "TIBC", "FERRITIN"  Attestation Statements:   Reviewed by clinician on day of visit: allergies, medications, problem list, medical history, surgical history, family history, social history, and previous encounter notes.   I, Burt Knack, am acting as transcriptionist for Quillian Quince, MD.  I have reviewed the above documentation for accuracy and completeness, and I agree with the above. -  ***

## 2023-01-11 DIAGNOSIS — F4322 Adjustment disorder with anxiety: Secondary | ICD-10-CM | POA: Diagnosis not present

## 2023-01-18 ENCOUNTER — Other Ambulatory Visit (HOSPITAL_COMMUNITY): Payer: Self-pay | Admitting: General Surgery

## 2023-01-18 DIAGNOSIS — Z9884 Bariatric surgery status: Secondary | ICD-10-CM

## 2023-01-20 ENCOUNTER — Encounter (INDEPENDENT_AMBULATORY_CARE_PROVIDER_SITE_OTHER): Payer: Self-pay | Admitting: Internal Medicine

## 2023-01-20 ENCOUNTER — Ambulatory Visit (INDEPENDENT_AMBULATORY_CARE_PROVIDER_SITE_OTHER): Payer: 59 | Admitting: Internal Medicine

## 2023-01-20 VITALS — BP 132/80 | HR 88 | Temp 98.6°F | Ht 66.0 in | Wt 259.0 lb

## 2023-01-20 DIAGNOSIS — Z9884 Bariatric surgery status: Secondary | ICD-10-CM | POA: Diagnosis not present

## 2023-01-20 DIAGNOSIS — F3289 Other specified depressive episodes: Secondary | ICD-10-CM

## 2023-01-20 DIAGNOSIS — Z6841 Body Mass Index (BMI) 40.0 and over, adult: Secondary | ICD-10-CM

## 2023-01-20 MED ORDER — BUPROPION HCL ER (SR) 150 MG PO TB12: 150.0000 mg | ORAL_TABLET | Freq: Every day | ORAL | 0 refills | Status: DC

## 2023-01-20 NOTE — Assessment & Plan Note (Signed)
Patient is experiencing symptoms of dysphagia particularly around solids.  This affects her ability to eat certain consistencies.  I had recommended she see a bariatric surgeon to check the functionality and exclude complications from a migrating band.  She is scheduled to have an upper GI series and has decided to have Lap-Band removal in the not-too-distant future.

## 2023-01-20 NOTE — Progress Notes (Signed)
Office: [phone]  /  Fax: [phone]  WEIGHT SUMMARY AND BIOMETRICS  Vitals Temp: 98.6 F (37 C) BP: 132/80 Pulse Rate: 88 SpO2: 99 %   Anthropometric Measurements Height: 5\' 6"  (1.676 m) Weight: 259 lb (117.5 kg) BMI (Calculated): 41.82 Weight at Last Visit: 259 lb Weight Lost Since Last Visit: 0 lb Weight Gained Since Last Visit: 0 lb Starting Weight: 259 lb Total Weight Loss (lbs): 0 lb (0 kg) Peak Weight: 267 lb   Body Composition  Body Fat %: 48.5 % Fat Mass (lbs): 125.8 lbs Muscle Mass (lbs): 126.6 lbs Total Body Water (lbs): 96 lbs Visceral Fat Rating : 15    No data recorded Today's Visit #: 4  No data recorded  HPI  Chief Complaint: OBESITY  Bailey Cooper is here to discuss her progress with her obesity treatment plan. She is on the the Category 2 Plan and states she is following her eating plan approximately 50 % of the time. She states she is exercising 45 minutes 3 times per week.  Interval History:  Since last office visit she remains weight neutral.  She was seen by my colleague and was started on bupropion for emotional eating.  She reports medication has helped with cravings during the day but she still having cravings at night.  She has difficulty with certain food consistencies due to Lap-Band. This limits certain types of food. We had referred her to surgery and is scheduled to undergo an upper GI series in the near future.  She is considering having Lap-Band removed.  This causes restriction when swallowing.  She has been drinking protein shakes for some of her meals.   Pharmacotherapy for weight loss: She is currently taking Bupropion (single agent, off label use) .    ASSESSMENT AND PLAN  TREATMENT PLAN FOR OBESITY:  Recommended Dietary Goals  Bailey Cooper is currently in the action stage of change. As such, her goal is to continue weight management plan. She has agreed to: continue current plan.  Behavioral Intervention  We discussed the  following Behavioral Modification Strategies today: increasing lean protein intake, decreasing simple carbohydrates , increasing vegetables, increasing lower glycemic fruits, and increasing water intake.  Additional resources provided today: None  Recommended Physical Activity Goals  Bailey Cooper has been advised to work up to 150 minutes of moderate intensity aerobic activity a week and strengthening exercises 2-3 times per week for cardiovascular health, weight loss maintenance and preservation of muscle mass.   She has agreed to :  Think about ways to increase physical activity  Pharmacotherapy We discussed various medication options to help Bailey Cooper with her weight loss efforts and we both agreed to : continue with nutritional and behavioral strategies  ASSOCIATED CONDITIONS ADDRESSED TODAY  Emotional Eating Behavior Assessment & Plan: Patient still having emotional eating tendencies in the evening.  We will increase her bupropion to twice a day.  She will monitor for symptoms of insomnia or anxiety.  We also need to monitor her blood pressure.    Orders: -     buPROPion HCl ER (SR); Take 1 tablet (150 mg total) by mouth daily.  Dispense: 60 tablet; Refill: 0  Hx of laparoscopic adjustable gastric banding Assessment & Plan: Patient is experiencing symptoms of dysphagia particularly around solids.  This affects her ability to eat certain consistencies.  I had recommended she see a bariatric surgeon to check the functionality and exclude complications from a migrating band.  She is scheduled to have an upper GI series and  has decided to have Lap-Band removal in the not-too-distant future.   Class 3 severe obesity without serious comorbidity with body mass index (BMI) of 40.0 to 44.9 in adult, unspecified obesity type (HCC)    PHYSICAL EXAM:  Blood pressure 132/80, pulse 88, temperature 98.6 F (37 C), height 5\' 6"  (1.676 m), weight 259 lb (117.5 kg), SpO2 99 %, unknown if currently  breastfeeding. Body mass index is 41.8 kg/m.  General: She is overweight, cooperative, alert, well developed, and in no acute distress. PSYCH: Has normal mood, affect and thought process.   HEENT: EOMI, sclerae are anicteric. Lungs: Normal breathing effort, no conversational dyspnea. Extremities: No edema.  Neurologic: No gross sensory or motor deficits. No tremors or fasciculations noted.    DIAGNOSTIC DATA REVIEWED:  BMET    Component Value Date/Time   NA 141 11/24/2022 1225   K 4.1 11/24/2022 1225   CL 105 11/24/2022 1225   CO2 22 11/24/2022 1225   GLUCOSE 95 11/24/2022 1225   GLUCOSE 91 04/07/2022 1936   BUN 13 11/24/2022 1225   CREATININE 0.73 11/24/2022 1225   CALCIUM 10.1 11/24/2022 1225   GFRNONAA 54 (L) 04/07/2022 1936   Lab Results  Component Value Date   HGBA1C 5.5 11/24/2022   Lab Results  Component Value Date   INSULIN 17.7 11/24/2022   Lab Results  Component Value Date   TSH 1.990 11/24/2022   CBC    Component Value Date/Time   WBC 3.2 (L) 11/24/2022 1225   WBC 4.2 04/07/2022 1936   RBC 4.85 11/24/2022 1225   RBC 5.23 (H) 04/07/2022 1936   HGB 12.9 11/24/2022 1225   HCT 39.9 11/24/2022 1225   PLT 197 11/24/2022 1225   MCV 82 11/24/2022 1225   MCH 26.6 11/24/2022 1225   MCH 26.4 04/07/2022 1936   MCHC 32.3 11/24/2022 1225   MCHC 31.9 04/07/2022 1936   RDW 13.5 11/24/2022 1225   Iron Studies No results found for: "IRON", "TIBC", "FERRITIN", "IRONPCTSAT" Lipid Panel     Component Value Date/Time   CHOL 183 11/24/2022 1225   TRIG 77 11/24/2022 1225   HDL 65 11/24/2022 1225   LDLCALC 104 (H) 11/24/2022 1225   Hepatic Function Panel     Component Value Date/Time   PROT 6.9 11/24/2022 1225   ALBUMIN 4.4 11/24/2022 1225   AST 23 11/24/2022 1225   ALT 24 11/24/2022 1225   ALKPHOS 98 11/24/2022 1225   BILITOT 0.3 11/24/2022 1225      Component Value Date/Time   TSH 1.990 11/24/2022 1225   Nutritional Lab Results  Component Value  Date   VD25OH 35.9 11/24/2022     Return for For Weight Mangement with Dr. Rikki Spearing in mid June.Marland Kitchen She was informed of the importance of frequent follow up visits to maximize her success with intensive lifestyle modifications for her multiple health conditions.   ATTESTASTION STATEMENTS:  Reviewed by clinician on day of visit: allergies, medications, problem list, medical history, surgical history, family history, social history, and previous encounter notes.     Worthy Rancher, MD

## 2023-01-20 NOTE — Assessment & Plan Note (Signed)
Patient still having emotional eating tendencies in the evening.  We will increase her bupropion to twice a day.  She will monitor for symptoms of insomnia or anxiety.  We also need to monitor her blood pressure.

## 2023-01-25 ENCOUNTER — Ambulatory Visit (HOSPITAL_COMMUNITY)
Admission: RE | Admit: 2023-01-25 | Discharge: 2023-01-25 | Disposition: A | Discharge status: Home / Self Care | Payer: 59 | Source: Ambulatory Visit | Attending: General Surgery | Admitting: General Surgery

## 2023-01-25 DIAGNOSIS — F4322 Adjustment disorder with anxiety: Secondary | ICD-10-CM | POA: Diagnosis not present

## 2023-01-25 DIAGNOSIS — Z9884 Bariatric surgery status: Secondary | ICD-10-CM

## 2023-01-25 DIAGNOSIS — R1310 Dysphagia, unspecified: Secondary | ICD-10-CM | POA: Diagnosis not present

## 2023-02-03 ENCOUNTER — Ambulatory Visit (INDEPENDENT_AMBULATORY_CARE_PROVIDER_SITE_OTHER): Payer: 59 | Admitting: Internal Medicine

## 2023-02-09 ENCOUNTER — Ambulatory Visit (INDEPENDENT_AMBULATORY_CARE_PROVIDER_SITE_OTHER): Payer: 59 | Admitting: Internal Medicine

## 2023-02-09 DIAGNOSIS — F4322 Adjustment disorder with anxiety: Secondary | ICD-10-CM | POA: Diagnosis not present

## 2023-02-10 ENCOUNTER — Ambulatory Visit (INDEPENDENT_AMBULATORY_CARE_PROVIDER_SITE_OTHER): Payer: 59 | Admitting: Family Medicine

## 2023-02-22 DIAGNOSIS — F4322 Adjustment disorder with anxiety: Secondary | ICD-10-CM | POA: Diagnosis not present

## 2023-02-25 ENCOUNTER — Ambulatory Visit (INDEPENDENT_AMBULATORY_CARE_PROVIDER_SITE_OTHER): Payer: 59 | Admitting: Internal Medicine

## 2023-03-02 ENCOUNTER — Encounter (INDEPENDENT_AMBULATORY_CARE_PROVIDER_SITE_OTHER): Payer: Self-pay | Admitting: Family Medicine

## 2023-03-02 ENCOUNTER — Ambulatory Visit (INDEPENDENT_AMBULATORY_CARE_PROVIDER_SITE_OTHER): Payer: 59 | Admitting: Family Medicine

## 2023-03-02 VITALS — BP 128/82 | HR 89 | Temp 97.9°F | Ht 66.0 in | Wt 263.0 lb

## 2023-03-02 DIAGNOSIS — R632 Polyphagia: Secondary | ICD-10-CM | POA: Diagnosis not present

## 2023-03-02 DIAGNOSIS — Z6841 Body Mass Index (BMI) 40.0 and over, adult: Secondary | ICD-10-CM | POA: Diagnosis not present

## 2023-03-02 DIAGNOSIS — E669 Obesity, unspecified: Secondary | ICD-10-CM | POA: Diagnosis not present

## 2023-03-02 DIAGNOSIS — F3289 Other specified depressive episodes: Secondary | ICD-10-CM

## 2023-03-02 MED ORDER — BUPROPION HCL ER (SR) 150 MG PO TB12: 150.0000 mg | ORAL_TABLET | Freq: Two times a day (BID) | ORAL | 0 refills | Status: DC

## 2023-03-03 NOTE — Progress Notes (Unsigned)
Chief Complaint:   OBESITY Bailey Cooper is here to discuss her progress with her obesity treatment plan along with follow-up of her obesity related diagnoses. Bailey Cooper is on the Category 2 Plan and states she is following her eating plan approximately 0% of the time. Bailey Cooper states she is on the treadmill for 30-60 minutes 4 times per week.  Today's visit was #: 6 Starting weight: 259 lbs Starting date: 11/24/2022 Today's weight: 263 lbs Today's date: 03/02/2023 Total lbs lost to date: 0 Total lbs lost since last in-office visit: 0  Interim History: Patient has not been able to concentrate on meal planning, as her stress is high and she is much busier at home and at work.  Subjective:   1. Polyphagia Patient's insurance will not cover GLP-1 for weight loss, only diabetes.  She is struggling to eat food on her plan and her increased hunger has not improved.  2. Emotional Eating Behavior Patient is struggling with increased emotional eating behavior.  She is not getting much help with her current Wellbutrin dose.  Assessment/Plan:   1. Polyphagia Patient is to work on eating all of the food on her plan and see if this decreases her polyphagia.  2. Emotional Eating Behavior Patient agreed to increase Wellbutrin SR to 150 mg twice daily, and we will refill for 1 month.  - buPROPion (WELLBUTRIN SR) 150 MG 12 hr tablet; Take 1 tablet (150 mg total) by mouth 2 (two) times daily.  Dispense: 60 tablet; Refill: 0  3. BMI 40.0-44.9, adult (HCC)  4. Obesity, Beginning BMI 41.8 Bailey Cooper is currently in the action stage of change. As such, her goal is to continue with weight loss efforts. She has agreed to the Category 2 Plan.   Easy at home meals were discussed to help decrease eating out.  Exercise goals: As is.   Behavioral modification strategies: increasing lean protein intake, decreasing eating out, and no skipping meals.  Bailey Cooper has agreed to follow-up with our clinic in 3 to 4 weeks. She  was informed of the importance of frequent follow-up visits to maximize her success with intensive lifestyle modifications for her multiple health conditions.   Objective:   Blood pressure 128/82, pulse 89, temperature 97.9 F (36.6 C), height 5\' 6"  (1.676 m), weight 263 lb (119.3 kg), SpO2 98 %, unknown if currently breastfeeding. Body mass index is 42.45 kg/m.  Lab Results  Component Value Date   CREATININE 0.73 11/24/2022   BUN 13 11/24/2022   NA 141 11/24/2022   K 4.1 11/24/2022   CL 105 11/24/2022   CO2 22 11/24/2022   Lab Results  Component Value Date   ALT 24 11/24/2022   AST 23 11/24/2022   ALKPHOS 98 11/24/2022   BILITOT 0.3 11/24/2022   Lab Results  Component Value Date   HGBA1C 5.5 11/24/2022   Lab Results  Component Value Date   INSULIN 17.7 11/24/2022   Lab Results  Component Value Date   TSH 1.990 11/24/2022   Lab Results  Component Value Date   CHOL 183 11/24/2022   HDL 65 11/24/2022   LDLCALC 104 (H) 11/24/2022   TRIG 77 11/24/2022   Lab Results  Component Value Date   VD25OH 35.9 11/24/2022   Lab Results  Component Value Date   WBC 3.2 (L) 11/24/2022   HGB 12.9 11/24/2022   HCT 39.9 11/24/2022   MCV 82 11/24/2022   PLT 197 11/24/2022   No results found for: "IRON", "TIBC", "FERRITIN"  Attestation Statements:   Reviewed by clinician on day of visit: allergies, medications, problem list, medical history, surgical history, family history, social history, and previous encounter notes.   I, Trixie Dredge, am acting as transcriptionist for Dennard Nip, MD.  I have reviewed the above documentation for accuracy and completeness, and I agree with the above. -  Dennard Nip, MD

## 2023-04-01 DIAGNOSIS — R632 Polyphagia: Secondary | ICD-10-CM | POA: Diagnosis not present

## 2023-04-01 DIAGNOSIS — Z9884 Bariatric surgery status: Secondary | ICD-10-CM | POA: Diagnosis not present

## 2023-04-05 ENCOUNTER — Other Ambulatory Visit (HOSPITAL_COMMUNITY): Payer: Self-pay | Admitting: General Surgery

## 2023-04-06 ENCOUNTER — Encounter (INDEPENDENT_AMBULATORY_CARE_PROVIDER_SITE_OTHER): Payer: Self-pay | Admitting: Family Medicine

## 2023-04-06 ENCOUNTER — Ambulatory Visit (INDEPENDENT_AMBULATORY_CARE_PROVIDER_SITE_OTHER): Payer: 59 | Admitting: Family Medicine

## 2023-04-06 VITALS — BP 118/78 | HR 80 | Temp 98.2°F | Ht 66.0 in | Wt 259.0 lb

## 2023-04-06 DIAGNOSIS — N951 Menopausal and female climacteric states: Secondary | ICD-10-CM | POA: Diagnosis not present

## 2023-04-06 DIAGNOSIS — E669 Obesity, unspecified: Secondary | ICD-10-CM

## 2023-04-06 DIAGNOSIS — Z6841 Body Mass Index (BMI) 40.0 and over, adult: Secondary | ICD-10-CM | POA: Diagnosis not present

## 2023-04-06 DIAGNOSIS — F3289 Other specified depressive episodes: Secondary | ICD-10-CM | POA: Diagnosis not present

## 2023-04-06 DIAGNOSIS — F4322 Adjustment disorder with anxiety: Secondary | ICD-10-CM | POA: Diagnosis not present

## 2023-04-06 MED ORDER — BUPROPION HCL ER (SR) 150 MG PO TB12: 150.0000 mg | ORAL_TABLET | Freq: Two times a day (BID) | ORAL | 0 refills | Status: DC

## 2023-04-06 NOTE — Progress Notes (Unsigned)
Chief Complaint:   OBESITY Bailey Cooper is here to discuss her progress with her obesity treatment plan along with follow-up of her obesity related diagnoses. Bailey Cooper is on the Category 2 Plan and states she is following her eating plan approximately 25% of the time. Bailey Cooper states she is on the treadmill for 30 minutes 3-5 times per week.  Today's visit was #: 7 Starting weight: 259 lbs Starting date: 11/24/2022 Today's weight: 259 lbs Today's date: 04/06/2023 Total lbs lost to date: 0 Total lbs lost since last in-office visit: 4  Interim History: Patient has done well with her weight loss.  She is struggling with menopausal symptoms.  She notes increased p.m. hunger/cravings.  She will be going on a cruise soon.  Subjective:   1. Perimenopause Patient notes increased menopausal symptoms.  She has hot flashes mostly at night.  She notes feeling mentally "foggy" more often.  She wants to avoid hormones/medications.  2. Emotional Eating Behavior Patient is on Wellbutrin, and she is working on taking it more regularly.  Assessment/Plan:   1. Perimenopause Patient was advised that black cohosh has some evidence that it may help.  Simple carbohydrates and EtOH can increase flashes.  We will see how she does in 1 month.  2. Emotional Eating Behavior Patient will continue Wellbutrin SR 150 mg twice daily, and we will refill for 1 month.  Emotional eating behavior strategies were discussed.  - buPROPion (WELLBUTRIN SR) 150 MG 12 hr tablet; Take 1 tablet (150 mg total) by mouth 2 (two) times daily.  Dispense: 60 tablet; Refill: 0  3. BMI 40.0-44.9, adult (HCC)  4. Obesity, Beginning BMI 41.8 Bailey Cooper is currently in the action stage of change. As such, her goal is to continue with weight loss efforts. She has agreed to the Category 2 Plan.   Exercise goals: As is.   Behavioral modification strategies: increasing lean protein intake.  Bailey Cooper has agreed to follow-up with our clinic in 4 weeks.  She was informed of the importance of frequent follow-up visits to maximize her success with intensive lifestyle modifications for her multiple health conditions.   Objective:   Blood pressure 118/78, pulse 80, temperature 98.2 F (36.8 C), height 5\' 6"  (1.676 m), weight 259 lb (117.5 kg), unknown if currently breastfeeding. Body mass index is 41.8 kg/m.  General: Cooperative, alert, well developed, in no acute distress. HEENT: Conjunctivae and lids unremarkable. Cardiovascular: Regular rhythm.  Lungs: Normal work of breathing. Neurologic: No focal deficits.   Lab Results  Component Value Date   CREATININE 0.73 11/24/2022   BUN 13 11/24/2022   NA 141 11/24/2022   K 4.1 11/24/2022   CL 105 11/24/2022   CO2 22 11/24/2022   Lab Results  Component Value Date   ALT 24 11/24/2022   AST 23 11/24/2022   ALKPHOS 98 11/24/2022   BILITOT 0.3 11/24/2022   Lab Results  Component Value Date   HGBA1C 5.5 11/24/2022   Lab Results  Component Value Date   INSULIN 17.7 11/24/2022   Lab Results  Component Value Date   TSH 1.990 11/24/2022   Lab Results  Component Value Date   CHOL 183 11/24/2022   HDL 65 11/24/2022   LDLCALC 104 (H) 11/24/2022   TRIG 77 11/24/2022   Lab Results  Component Value Date   VD25OH 35.9 11/24/2022   Lab Results  Component Value Date   WBC 3.2 (L) 11/24/2022   HGB 12.9 11/24/2022   HCT 39.9 11/24/2022  MCV 82 11/24/2022   PLT 197 11/24/2022   No results found for: "IRON", "TIBC", "FERRITIN"  Attestation Statements:   Reviewed by clinician on day of visit: allergies, medications, problem list, medical history, surgical history, family history, social history, and previous encounter notes.   I, Burt Knack, am acting as transcriptionist for Quillian Quince, MD.  I have reviewed the above documentation for accuracy and completeness, and I agree with the above. -  Quillian Quince, MD

## 2023-04-13 ENCOUNTER — Ambulatory Visit (HOSPITAL_COMMUNITY)
Admission: RE | Admit: 2023-04-13 | Discharge: 2023-04-13 | Disposition: A | Discharge status: Home / Self Care | Payer: 59 | Source: Ambulatory Visit | Attending: General Surgery | Admitting: General Surgery

## 2023-04-13 DIAGNOSIS — Z01818 Encounter for other preprocedural examination: Secondary | ICD-10-CM | POA: Insufficient documentation

## 2023-04-15 DIAGNOSIS — F4322 Adjustment disorder with anxiety: Secondary | ICD-10-CM | POA: Diagnosis not present

## 2023-04-20 DIAGNOSIS — F4322 Adjustment disorder with anxiety: Secondary | ICD-10-CM | POA: Diagnosis not present

## 2023-04-29 DIAGNOSIS — F4322 Adjustment disorder with anxiety: Secondary | ICD-10-CM | POA: Diagnosis not present

## 2023-05-03 ENCOUNTER — Encounter: Payer: 59 | Attending: General Surgery | Admitting: Dietician

## 2023-05-03 ENCOUNTER — Encounter: Payer: Self-pay | Admitting: Dietician

## 2023-05-03 VITALS — Ht 66.0 in | Wt 265.6 lb

## 2023-05-03 DIAGNOSIS — E669 Obesity, unspecified: Secondary | ICD-10-CM | POA: Diagnosis not present

## 2023-05-03 NOTE — Progress Notes (Signed)
Nutrition Assessment for Bariatric Surgery: Pre-Surgery Behavioral and Nutrition Intervention Program   Medical Nutrition Therapy  Appt Start Time: 10:14    End Time: 11:13  Patient was seen on 05/03/2023 for Pre-Operative Nutrition Assessment. Purpose of todays visit  enhance perioperative outcomes along with a healthy weight maintenance   Referral stated Supervised Weight Loss (SWL) visits needed: 0  Pt completed visits.   Pt has cleared nutrition requirements.   Planned surgery: Revision, band to sleeve gastrectomy Pt expectation of surgery: to be about 180 lbs   NUTRITION ASSESSMENT   Anthropometrics  Start weight at [facility]: 265.6 lbs (date: 05/03/2023)  Height: 66 in BMI: 42.87 kg/m2     Clinical   Pharmacotherapy: History of weight loss medication used: phentermine  Medical hx: obesity Medications: bupropion;   Labs: LDL 104 Notable signs/symptoms: none noted Any previous deficiencies? No  Evaluation of Nutritional Deficiencies: Micronutrient Nutrition Focused Physical Exam: Hair: No issues observed Eyes: No issues observed Mouth: No issues observed Neck: No issues observed Nails: No issues observed Skin: No issues observed  Lifestyle & Dietary Hx  Pt states she tries to not skip breakfast, stating she is working on it.  Current Physical Activity Recommendations state 150 minutes per week of moderate to vigorous movement including Cardio and 1-2 days of resistance activities as well as flexibility/balance activities:  Pts current physical activity: gym 3 days a week, 30-60 minutes, with 50-100% recommendation reached   Sleep Hygiene: duration and quality: pt states she is going through menopause, stating her sleep sucks.  Current Patient Perceived Stress Level as stated by pt on a scale of 1-10:  8       Stress Management Techniques: push through  According to the Dietary Guidelines for Americans Recommendation: equivalent 1.5-2 cups fruits per day,  equivalent 2-3 cups vegetables per day and at least half all grains whole  Fruit servings per day (on average): 1, meeting 50-75% recommendation  Non-starchy vegetable servings per day (on average): 1, meeting 33-50% recommendation  Whole Grains per day (on average): 2  Number of meals missed/skipped per week out of 21: 4-7  24-Hr Dietary Recall First Meal: coffee, skip or smoothie (fruit, protein powder, oats, almond milk, peanut butter, spices, maple syrup, fiber (psyllium)) or eggs or cheerios or oatmeal Snack: maybe chips or pound cake or nuts Second Meal: sandwich ([place]) or shrimp and vegetables or chicken wings or salad/fries or frozen meals Snack: maybe chips or pound cake or nuts Third Meal: meat and vegetables, sometimes with a starch (rice or peas), salad Snack: maybe chips or pound cake or nuts or nutrigrain bars Beverages: water, water with flavorings, sometimes soda or lemonade, coffee (with cream and sugar)  Alcoholic beverages per week: 0-1   Estimated Energy Needs Calories: 1500  NUTRITION DIAGNOSIS  Overweight/obesity (Elmo-3.3) related to past poor dietary habits and physical inactivity as evidenced by patient w/ planned revision from band to sleeve surgery following dietary guidelines for continued weight loss.    NUTRITION INTERVENTION  Nutrition counseling (C-1) and education (E-2) to facilitate bariatric surgery goals.  Educated pt on micronutrient deficiencies post-surgery and behavioral/dietary strategies to start in order to mitigate that risk   Behavioral and Dietary Interventions Pre-Op Goals Reviewed with the Patient Nutrition: Healthy Eating Behaviors Switch to non-caloric, non-carbonated and non-caffeinated beverages such as  water, unsweetened tea, Crystal Light and zero calorie beverages (aim for 64 oz. per day) Cut out grazing between meals or at night  Find a protein shake you like  Eat every 3-5 hours        Eliminate distractions while eating  (TV, computer, reading, driving, texting) Take 60-63 minutes to eat a meal  Decrease high sugar foods/decrease high fat/fried foods Eliminate alcoholic beverages Increase protein intake (eggs, fish, chicken, yogurt) before surgery Eat non starchy vegetables 2 times a day 7 days a week Eat complex carbohydrates such as whole grains and fruits   Behavioral Modification: Physical Activity Increase my usual daily activity (use stairs, park farther, etc.) Engage in _______________________  activity  _______ minutes ______ times per week  Other:    _________________________________________________________________     Problem Solving I will think about my usual eating patterns and how to tweak them How can my friends and family support me Barriers to starting my changes Learn and understand appetite verses hunger   Healthy Coping Allow for ___________ activities per week to help me manage stress Reframe negative thoughts I will keep a picture of someone or something that is my inspiration & look at it daily   Monitoring  Weigh myself once a week  Measure my progress by monitoring how my clothes fit Keep a food record of what I eat and drink for the next ________ (time period) Take pictures of what I eat and drink for the next ________ (time period) Use an app to count steps/day for the next_______ (time period) Measure my progress such as increased energy and more restful sleep Monitor your acid reflux and bowel habits, are they getting better?   *Goals that are bolded indicate the pt would like to start working towards these  Handouts Provided Include  Bariatric Surgery handouts (Nutrition Visits, Pre Surgery Behavioral Change Goals, Protein Shakes Brands to Choose From, Vitamins & Mineral Supplementation)  Learning Style & Readiness for Change Teaching method utilized: Visual, Auditory, and hands on  Demonstrated degree of understanding via: Teach Back  Readiness Level:  preparation Barriers to learning/adherence to lifestyle change: nothing identified  RD's Notes for Next Visit     MONITORING & EVALUATION Dietary intake, weekly physical activity, body weight, and preoperative behavioral change goals   Next Steps  Pt has completed visits. No further supervised visits required/recommended. Patient is to follow up at [facility] for pre-op class >2 weeks prior to scheduled surgery.

## 2023-05-04 ENCOUNTER — Ambulatory Visit (INDEPENDENT_AMBULATORY_CARE_PROVIDER_SITE_OTHER): Payer: 59 | Admitting: Family Medicine

## 2023-05-04 ENCOUNTER — Encounter (INDEPENDENT_AMBULATORY_CARE_PROVIDER_SITE_OTHER): Payer: Self-pay | Admitting: Family Medicine

## 2023-05-04 VITALS — BP 126/74 | HR 73 | Temp 98.6°F | Ht 66.0 in | Wt 260.0 lb

## 2023-05-04 DIAGNOSIS — E669 Obesity, unspecified: Secondary | ICD-10-CM | POA: Diagnosis not present

## 2023-05-04 DIAGNOSIS — E88819 Insulin resistance, unspecified: Secondary | ICD-10-CM

## 2023-05-04 DIAGNOSIS — Z6841 Body Mass Index (BMI) 40.0 and over, adult: Secondary | ICD-10-CM

## 2023-05-04 DIAGNOSIS — F3289 Other specified depressive episodes: Secondary | ICD-10-CM | POA: Diagnosis not present

## 2023-05-04 NOTE — Progress Notes (Signed)
Chief Complaint:   OBESITY Bailey Cooper is here to discuss her progress with her obesity treatment plan along with follow-up of her obesity related diagnoses. Bailey Cooper is on the Category 2 Plan and states she is following her eating plan approximately 50% of the time. Bailey Cooper states she is doing 0 minutes 0 times per week.  Today's visit was #: 8 Starting weight: 259 lbs Starting date: 11/24/2022 Today's weight: 260 lbs Today's date: 05/04/2023 Total lbs lost to date: 0 Total lbs lost since last in-office visit: 0  Interim History: Patient normally sees Dr. Dalbert Garnet and Dr. Rikki Spearing.  She likes the clinic but realizes that she tends to be less compliant when coming every month instead of every 2 weeks.  She has decided that she is going to do a conversion of lap band to sleeve gastrectomy.  Not sure yet when that procedure will be scheduled. Her biggest issue is night eating.  She isn't following the category 2 as much.  She no longer has any liquid in her lap band.   Subjective:   1. Insulin resistance Patient's last A1c was 5.5 and insulin 17.7.  2. Other depression, with emotional eating Patient is on Wellbutrin twice daily but she stopped while on vacation, but restarted and she is starting to notice improvement in emotional eating.  Assessment/Plan:   1. Insulin resistance Patient is to start journaling 1,400-1,500 calories with 90+ grams of protein.  We will repeat labs in October.  2. Other depression, with emotional eating Patient will continue Wellbutrin with no change in dose.  3. BMI 45.0-49.9, adult (HCC)  4. Obesity, Beginning BMI 41.8 Bailey Cooper is currently in the action stage of change. As such, her goal is to continue with weight loss efforts. She has agreed to keeping a food journal and adhering to recommended goals of 1400-1500 calories and 90+ grams of protein daily.   Exercise goals: No exercise has been prescribed at this time.  Behavioral modification strategies:  increasing lean protein intake, meal planning and cooking strategies, keeping healthy foods in the home, and planning for success.  Bailey Cooper has agreed to follow-up with our clinic in 2 to 3 weeks. She was informed of the importance of frequent follow-up visits to maximize her success with intensive lifestyle modifications for her multiple health conditions.   Objective:   Blood pressure 126/74, pulse 73, temperature 98.6 F (37 C), height 5\' 6"  (1.676 m), weight 260 lb (117.9 kg), SpO2 100%, unknown if currently breastfeeding. Body mass index is 41.97 kg/m.  General: Cooperative, alert, well developed, in no acute distress. HEENT: Conjunctivae and lids unremarkable. Cardiovascular: Regular rhythm.  Lungs: Normal work of breathing. Neurologic: No focal deficits.   Lab Results  Component Value Date   CREATININE 0.73 11/24/2022   BUN 13 11/24/2022   NA 141 11/24/2022   K 4.1 11/24/2022   CL 105 11/24/2022   CO2 22 11/24/2022   Lab Results  Component Value Date   ALT 24 11/24/2022   AST 23 11/24/2022   ALKPHOS 98 11/24/2022   BILITOT 0.3 11/24/2022   Lab Results  Component Value Date   HGBA1C 5.5 11/24/2022   Lab Results  Component Value Date   INSULIN 17.7 11/24/2022   Lab Results  Component Value Date   TSH 1.990 11/24/2022   Lab Results  Component Value Date   CHOL 183 11/24/2022   HDL 65 11/24/2022   LDLCALC 104 (H) 11/24/2022   TRIG 77 11/24/2022   Lab Results  Component Value Date   VD25OH 35.9 11/24/2022   Lab Results  Component Value Date   WBC 3.2 (L) 11/24/2022   HGB 12.9 11/24/2022   HCT 39.9 11/24/2022   MCV 82 11/24/2022   PLT 197 11/24/2022   No results found for: "IRON", "TIBC", "FERRITIN"  Attestation Statements:   Reviewed by clinician on day of visit: allergies, medications, problem list, medical history, surgical history, family history, social history, and previous encounter notes.   I, Burt Knack, am acting as transcriptionist  for Reuben Likes, MD.  I have reviewed the above documentation for accuracy and completeness, and I agree with the above. - Reuben Likes, MD

## 2023-05-06 DIAGNOSIS — F4322 Adjustment disorder with anxiety: Secondary | ICD-10-CM | POA: Diagnosis not present

## 2023-05-07 DIAGNOSIS — F4322 Adjustment disorder with anxiety: Secondary | ICD-10-CM | POA: Diagnosis not present

## 2023-05-24 DIAGNOSIS — F4322 Adjustment disorder with anxiety: Secondary | ICD-10-CM | POA: Diagnosis not present

## 2023-06-03 DIAGNOSIS — F4322 Adjustment disorder with anxiety: Secondary | ICD-10-CM | POA: Diagnosis not present

## 2023-06-07 ENCOUNTER — Ambulatory Visit (INDEPENDENT_AMBULATORY_CARE_PROVIDER_SITE_OTHER): Payer: 59 | Admitting: Family Medicine

## 2023-06-17 DIAGNOSIS — Z13220 Encounter for screening for lipoid disorders: Secondary | ICD-10-CM | POA: Diagnosis not present

## 2023-06-17 DIAGNOSIS — R7309 Other abnormal glucose: Secondary | ICD-10-CM | POA: Diagnosis not present

## 2023-06-17 DIAGNOSIS — R7989 Other specified abnormal findings of blood chemistry: Secondary | ICD-10-CM | POA: Diagnosis not present

## 2023-06-17 DIAGNOSIS — E538 Deficiency of other specified B group vitamins: Secondary | ICD-10-CM | POA: Diagnosis not present

## 2023-06-17 DIAGNOSIS — E559 Vitamin D deficiency, unspecified: Secondary | ICD-10-CM | POA: Diagnosis not present

## 2023-06-21 ENCOUNTER — Encounter (INDEPENDENT_AMBULATORY_CARE_PROVIDER_SITE_OTHER): Payer: 59 | Admitting: Family Medicine

## 2023-06-21 ENCOUNTER — Encounter (INDEPENDENT_AMBULATORY_CARE_PROVIDER_SITE_OTHER): Payer: Self-pay

## 2023-06-21 ENCOUNTER — Encounter (INDEPENDENT_AMBULATORY_CARE_PROVIDER_SITE_OTHER): Payer: Self-pay | Admitting: Family Medicine

## 2023-06-21 DIAGNOSIS — R7309 Other abnormal glucose: Secondary | ICD-10-CM | POA: Diagnosis not present

## 2023-06-21 DIAGNOSIS — Z23 Encounter for immunization: Secondary | ICD-10-CM | POA: Diagnosis not present

## 2023-06-21 DIAGNOSIS — E559 Vitamin D deficiency, unspecified: Secondary | ICD-10-CM | POA: Diagnosis not present

## 2023-06-21 DIAGNOSIS — D72819 Decreased white blood cell count, unspecified: Secondary | ICD-10-CM | POA: Diagnosis not present

## 2023-06-21 DIAGNOSIS — Z13220 Encounter for screening for lipoid disorders: Secondary | ICD-10-CM | POA: Diagnosis not present

## 2023-06-21 DIAGNOSIS — R7989 Other specified abnormal findings of blood chemistry: Secondary | ICD-10-CM | POA: Diagnosis not present

## 2023-06-21 DIAGNOSIS — Z0000 Encounter for general adult medical examination without abnormal findings: Secondary | ICD-10-CM | POA: Diagnosis not present

## 2023-06-21 DIAGNOSIS — Z124 Encounter for screening for malignant neoplasm of cervix: Secondary | ICD-10-CM | POA: Diagnosis not present

## 2023-06-22 ENCOUNTER — Ambulatory Visit (INDEPENDENT_AMBULATORY_CARE_PROVIDER_SITE_OTHER): Payer: 59 | Admitting: Family Medicine

## 2023-06-22 ENCOUNTER — Encounter (INDEPENDENT_AMBULATORY_CARE_PROVIDER_SITE_OTHER): Payer: Self-pay | Admitting: Family Medicine

## 2023-06-22 VITALS — BP 137/86 | Temp 98.9°F | Ht 66.0 in | Wt 259.0 lb

## 2023-06-22 DIAGNOSIS — F5089 Other specified eating disorder: Secondary | ICD-10-CM | POA: Diagnosis not present

## 2023-06-22 DIAGNOSIS — F4322 Adjustment disorder with anxiety: Secondary | ICD-10-CM | POA: Diagnosis not present

## 2023-06-22 DIAGNOSIS — E88819 Insulin resistance, unspecified: Secondary | ICD-10-CM

## 2023-06-22 DIAGNOSIS — Z6841 Body Mass Index (BMI) 40.0 and over, adult: Secondary | ICD-10-CM | POA: Diagnosis not present

## 2023-06-22 DIAGNOSIS — F3289 Other specified depressive episodes: Secondary | ICD-10-CM

## 2023-06-22 DIAGNOSIS — E669 Obesity, unspecified: Secondary | ICD-10-CM | POA: Diagnosis not present

## 2023-06-22 MED ORDER — BUPROPION HCL ER (SR) 150 MG PO TB12: 150.0000 mg | ORAL_TABLET | Freq: Two times a day (BID) | ORAL | 0 refills | Status: AC

## 2023-06-22 NOTE — Progress Notes (Signed)
Chief Complaint:   OBESITY Bailey Cooper is here to discuss her progress with her obesity treatment plan along with follow-up of her obesity related diagnoses. Harrison is on keeping a food journal and adhering to recommended goals of 1400-1500 calories and 90+ grams of protein and states she is following her eating plan approximately 60% of the time. [name] states she is at the gym 3 times per week.   Today's visit was #: 9 Starting weight: 259 lbs Starting date: 11/24/2022 Today's weight: 259 lbs Today's date: 06/22/2023 Total lbs lost to date: 0 Total lbs lost since last in-office visit: 1  Interim History: Patient voices that she initially lost around 7lbs then got sick with an URI and struggled to really get back on plan after starting to feel better. She got shingles shot yesterday and is planning to still get back into activity today.  She started getting more consistent with meal plan over he last week.  She is starting to log again. No upcoming travel or events.   Subjective:   1. Insulin resistance Patient's last A1c was 5.5 and insulin 17.7.  She is not on medications.  She got an A1c done with her PCP at her yearly physical.  2. Emotional Eating Behavior Patient is on Wellbutrin daily with improvement in symptoms.  She denies suicidal or homicidal ideations.  Assessment/Plan:   1. Insulin resistance We will follow-up on labs from patient's PCP from last week.  2. Emotional Eating Behavior We will refill Wellbutrin SR 150 mg twice daily for 90 days.  - buPROPion (WELLBUTRIN SR) 150 MG 12 hr tablet; Take 1 tablet (150 mg total) by mouth 2 (two) times daily.  Dispense: 180 tablet; Refill: 0  3. BMI 40.0-44.9, adult (HCC)  4. Obesity, Beginning BMI 41.8 [name] is currently in the action stage of change. As such, her goal is to continue with weight loss efforts. She has agreed to keeping a food journal and adhering to recommended goals of 1400-1500 calories and 90+ grams of  protein daily.   Exercise goals: All adults should avoid inactivity. Some physical activity is better than none, and adults who participate in any amount of physical activity gain some health benefits.  Behavioral modification strategies: increasing lean protein intake, meal planning and cooking strategies, keeping healthy foods in the home, planning for success, and keeping a strict food journal.  [name] has agreed to follow-up with our clinic in 4 weeks. She was informed of the importance of frequent follow-up visits to maximize her success with intensive lifestyle modifications for her multiple health conditions.   Objective:   Blood pressure 137/86, temperature 98.9 F (37.2 C), height 5\' 6"  (1.676 m), weight 259 lb (117.5 kg), unknown if currently breastfeeding. Body mass index is 41.8 kg/m.  General: Cooperative, alert, well developed, in no acute distress. HEENT: Conjunctivae and lids unremarkable. Cardiovascular: Regular rhythm.  Lungs: Normal work of breathing. Neurologic: No focal deficits.   Lab Results  Component Value Date   CREATININE 0.73 11/24/2022   BUN 13 11/24/2022   NA 141 11/24/2022   K 4.1 11/24/2022   CL 105 11/24/2022   CO2 22 11/24/2022   Lab Results  Component Value Date   ALT 24 11/24/2022   AST 23 11/24/2022   ALKPHOS 98 11/24/2022   BILITOT 0.3 11/24/2022   Lab Results  Component Value Date   HGBA1C 5.5 11/24/2022   Lab Results  Component Value Date   INSULIN 17.7 11/24/2022   Lab  Results  Component Value Date   TSH 1.990 11/24/2022   Lab Results  Component Value Date   CHOL 183 11/24/2022   HDL 65 11/24/2022   LDLCALC 104 (H) 11/24/2022   TRIG 77 11/24/2022   Lab Results  Component Value Date   VD25OH 35.9 11/24/2022   Lab Results  Component Value Date   WBC 3.2 (L) 11/24/2022   HGB 12.9 11/24/2022   HCT 39.9 11/24/2022   MCV 82 11/24/2022   PLT 197 11/24/2022   No results found for: "IRON", "TIBC",  "FERRITIN"  Attestation Statements:   Reviewed by clinician on day of visit: allergies, medications, problem list, medical history, surgical history, family history, social history, and previous encounter notes.   I, Burt Knack, am acting as transcriptionist for Reuben Likes, MD.  I have reviewed the above documentation for accuracy and completeness, and I agree with the above. - Reuben Likes, MD

## 2023-07-20 ENCOUNTER — Ambulatory Visit (INDEPENDENT_AMBULATORY_CARE_PROVIDER_SITE_OTHER): Payer: 59 | Admitting: Family Medicine

## 2023-07-25 DIAGNOSIS — Z1211 Encounter for screening for malignant neoplasm of colon: Secondary | ICD-10-CM | POA: Diagnosis not present

## 2023-07-25 DIAGNOSIS — Z1212 Encounter for screening for malignant neoplasm of rectum: Secondary | ICD-10-CM | POA: Diagnosis not present

## 2023-08-04 LAB — EXTERNAL GENERIC LAB PROCEDURE: COLOGUARD: POSITIVE — AB

## 2023-09-13 DIAGNOSIS — R195 Other fecal abnormalities: Secondary | ICD-10-CM | POA: Diagnosis not present

## 2023-09-13 DIAGNOSIS — Z1211 Encounter for screening for malignant neoplasm of colon: Secondary | ICD-10-CM | POA: Diagnosis not present

## 2023-09-13 DIAGNOSIS — K5904 Chronic idiopathic constipation: Secondary | ICD-10-CM | POA: Diagnosis not present

## 2023-09-29 DIAGNOSIS — F4322 Adjustment disorder with anxiety: Secondary | ICD-10-CM | POA: Diagnosis not present

## 2023-09-30 DIAGNOSIS — K635 Polyp of colon: Secondary | ICD-10-CM | POA: Diagnosis not present

## 2023-09-30 DIAGNOSIS — Z1211 Encounter for screening for malignant neoplasm of colon: Secondary | ICD-10-CM | POA: Diagnosis not present

## 2023-09-30 DIAGNOSIS — Z8371 Family history of adenomatous and serrated polyps: Secondary | ICD-10-CM | POA: Diagnosis not present

## 2023-09-30 DIAGNOSIS — K5730 Diverticulosis of large intestine without perforation or abscess without bleeding: Secondary | ICD-10-CM | POA: Diagnosis not present

## 2023-11-04 DIAGNOSIS — F4322 Adjustment disorder with anxiety: Secondary | ICD-10-CM | POA: Diagnosis not present

## 2023-11-10 DIAGNOSIS — F4322 Adjustment disorder with anxiety: Secondary | ICD-10-CM | POA: Diagnosis not present

## 2023-11-16 DIAGNOSIS — F4322 Adjustment disorder with anxiety: Secondary | ICD-10-CM | POA: Diagnosis not present

## 2023-11-23 DIAGNOSIS — F4322 Adjustment disorder with anxiety: Secondary | ICD-10-CM | POA: Diagnosis not present

## 2023-11-24 DIAGNOSIS — F4322 Adjustment disorder with anxiety: Secondary | ICD-10-CM | POA: Diagnosis not present

## 2023-11-30 DIAGNOSIS — F4322 Adjustment disorder with anxiety: Secondary | ICD-10-CM | POA: Diagnosis not present

## 2023-12-08 DIAGNOSIS — F4322 Adjustment disorder with anxiety: Secondary | ICD-10-CM | POA: Diagnosis not present

## 2023-12-14 NOTE — Progress Notes (Signed)
 Canceled appt.

## 2023-12-20 DIAGNOSIS — F4322 Adjustment disorder with anxiety: Secondary | ICD-10-CM | POA: Diagnosis not present

## 2023-12-24 DIAGNOSIS — F4322 Adjustment disorder with anxiety: Secondary | ICD-10-CM | POA: Diagnosis not present

## 2024-01-01 ENCOUNTER — Emergency Department (HOSPITAL_BASED_OUTPATIENT_CLINIC_OR_DEPARTMENT_OTHER)
Admission: EM | Admit: 2024-01-01 | Discharge: 2024-01-01 | Disposition: A | Discharge status: Home / Self Care | Attending: Emergency Medicine | Admitting: Emergency Medicine

## 2024-01-01 ENCOUNTER — Other Ambulatory Visit: Payer: Self-pay

## 2024-01-01 ENCOUNTER — Encounter (HOSPITAL_BASED_OUTPATIENT_CLINIC_OR_DEPARTMENT_OTHER): Payer: Self-pay

## 2024-01-01 ENCOUNTER — Emergency Department (HOSPITAL_BASED_OUTPATIENT_CLINIC_OR_DEPARTMENT_OTHER): Admitting: Radiology

## 2024-01-01 DIAGNOSIS — M19012 Primary osteoarthritis, left shoulder: Secondary | ICD-10-CM | POA: Diagnosis not present

## 2024-01-01 DIAGNOSIS — S46912A Strain of unspecified muscle, fascia and tendon at shoulder and upper arm level, left arm, initial encounter: Secondary | ICD-10-CM | POA: Diagnosis not present

## 2024-01-01 DIAGNOSIS — Y92410 Unspecified street and highway as the place of occurrence of the external cause: Secondary | ICD-10-CM | POA: Insufficient documentation

## 2024-01-01 DIAGNOSIS — M25512 Pain in left shoulder: Secondary | ICD-10-CM | POA: Diagnosis not present

## 2024-01-01 MED ORDER — IBUPROFEN 800 MG PO TABS
800.0000 mg | ORAL_TABLET | Freq: Once | ORAL | Status: AC
  Administered 2024-01-01: 800 mg via ORAL
  Filled 2024-01-01: qty 1

## 2024-01-01 MED ORDER — IBUPROFEN 600 MG PO TABS: 600.0000 mg | ORAL_TABLET | Freq: Four times a day (QID) | ORAL | 0 refills | Status: AC | PRN

## 2024-01-01 MED ORDER — CYCLOBENZAPRINE HCL 10 MG PO TABS: 10.0000 mg | ORAL_TABLET | Freq: Two times a day (BID) | ORAL | 0 refills | Status: AC | PRN

## 2024-01-01 NOTE — Discharge Instructions (Addendum)
 You have been evaluated for your symptoms.  Fortunately x-ray of the left shoulder today did not show any obvious broken bone or dislocation.  Take medication prescribed, follow-up with orthopedic specialist as needed.  Return if any concern.

## 2024-01-01 NOTE — ED Notes (Signed)
 Reviewed AVS/discharge instruction with patient. Time allotted for and all questions answered. Patient is agreeable for d/c and escorted to ed exit by staff.
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
## 2024-01-05 DIAGNOSIS — F4322 Adjustment disorder with anxiety: Secondary | ICD-10-CM | POA: Diagnosis not present

## 2024-02-16 DIAGNOSIS — F4322 Adjustment disorder with anxiety: Secondary | ICD-10-CM | POA: Diagnosis not present

## 2024-03-01 DIAGNOSIS — F4322 Adjustment disorder with anxiety: Secondary | ICD-10-CM | POA: Diagnosis not present
